# Patient Record
Sex: Female | Born: 1985 | Hispanic: No | Marital: Married | Smoking: Never smoker
Health system: Southern US, Community
[De-identification: ages and names within clinical notes are randomized; demographics above are authoritative.]

## PROBLEM LIST (undated history)

## (undated) DIAGNOSIS — E0590 Thyrotoxicosis, unspecified without thyrotoxic crisis or storm: Secondary | ICD-10-CM

## (undated) DIAGNOSIS — E039 Hypothyroidism, unspecified: Secondary | ICD-10-CM

## (undated) HISTORY — PX: EYE SURGERY: SHX253

---

## 2014-09-10 LAB — OB RESULTS CONSOLE GC/CHLAMYDIA
Chlamydia: NEGATIVE
Gonorrhea: NEGATIVE

## 2014-10-08 LAB — OB RESULTS CONSOLE ABO/RH: RH TYPE: POSITIVE

## 2014-10-08 LAB — OB RESULTS CONSOLE HEPATITIS B SURFACE ANTIGEN: HEP B S AG: NEGATIVE

## 2014-10-08 LAB — OB RESULTS CONSOLE HIV ANTIBODY (ROUTINE TESTING): HIV: NONREACTIVE

## 2014-10-08 LAB — OB RESULTS CONSOLE ANTIBODY SCREEN: ANTIBODY SCREEN: NEGATIVE

## 2014-10-08 LAB — OB RESULTS CONSOLE RUBELLA ANTIBODY, IGM: Rubella: UNDETERMINED

## 2014-10-08 LAB — OB RESULTS CONSOLE RPR: RPR: NONREACTIVE

## 2014-12-18 ENCOUNTER — Inpatient Hospital Stay (HOSPITAL_COMMUNITY): Admission: AD | Admit: 2014-12-18 | Payer: Self-pay | Source: Ambulatory Visit | Admitting: Obstetrics

## 2015-04-28 ENCOUNTER — Encounter (HOSPITAL_COMMUNITY): Payer: Self-pay | Admitting: *Deleted

## 2015-04-28 ENCOUNTER — Inpatient Hospital Stay (HOSPITAL_COMMUNITY)
Admission: EM | Admit: 2015-04-28 | Discharge: 2015-05-03 | DRG: 765 | Disposition: A | Discharge status: Home / Self Care | Payer: PPO | Source: Ambulatory Visit | Attending: Obstetrics | Admitting: Obstetrics

## 2015-04-28 ENCOUNTER — Inpatient Hospital Stay (HOSPITAL_COMMUNITY): Payer: PPO | Admitting: Anesthesiology

## 2015-04-28 DIAGNOSIS — Z79899 Other long term (current) drug therapy: Secondary | ICD-10-CM | POA: Diagnosis not present

## 2015-04-28 DIAGNOSIS — O139 Gestational [pregnancy-induced] hypertension without significant proteinuria, unspecified trimester: Secondary | ICD-10-CM | POA: Diagnosis present

## 2015-04-28 DIAGNOSIS — Z3A38 38 weeks gestation of pregnancy: Secondary | ICD-10-CM | POA: Diagnosis present

## 2015-04-28 DIAGNOSIS — E039 Hypothyroidism, unspecified: Secondary | ICD-10-CM | POA: Diagnosis present

## 2015-04-28 DIAGNOSIS — O9962 Diseases of the digestive system complicating childbirth: Secondary | ICD-10-CM | POA: Diagnosis present

## 2015-04-28 DIAGNOSIS — Z3490 Encounter for supervision of normal pregnancy, unspecified, unspecified trimester: Secondary | ICD-10-CM

## 2015-04-28 DIAGNOSIS — D62 Acute posthemorrhagic anemia: Secondary | ICD-10-CM | POA: Diagnosis not present

## 2015-04-28 DIAGNOSIS — Z2839 Other underimmunization status: Secondary | ICD-10-CM

## 2015-04-28 DIAGNOSIS — O9989 Other specified diseases and conditions complicating pregnancy, childbirth and the puerperium: Secondary | ICD-10-CM

## 2015-04-28 DIAGNOSIS — O99284 Endocrine, nutritional and metabolic diseases complicating childbirth: Secondary | ICD-10-CM | POA: Diagnosis present

## 2015-04-28 DIAGNOSIS — Z283 Underimmunization status: Secondary | ICD-10-CM

## 2015-04-28 DIAGNOSIS — O9902 Anemia complicating childbirth: Secondary | ICD-10-CM | POA: Diagnosis not present

## 2015-04-28 DIAGNOSIS — O3421 Maternal care for scar from previous cesarean delivery: Secondary | ICD-10-CM | POA: Diagnosis present

## 2015-04-28 DIAGNOSIS — K219 Gastro-esophageal reflux disease without esophagitis: Secondary | ICD-10-CM | POA: Diagnosis present

## 2015-04-28 DIAGNOSIS — Z98891 History of uterine scar from previous surgery: Secondary | ICD-10-CM

## 2015-04-28 DIAGNOSIS — O133 Gestational [pregnancy-induced] hypertension without significant proteinuria, third trimester: Principal | ICD-10-CM | POA: Diagnosis present

## 2015-04-28 HISTORY — DX: Thyrotoxicosis, unspecified without thyrotoxic crisis or storm: E05.90

## 2015-04-28 LAB — CBC
HCT: 36 % (ref 36.0–46.0)
Hemoglobin: 12.2 g/dL (ref 12.0–15.0)
MCH: 28 pg (ref 26.0–34.0)
MCHC: 33.9 g/dL (ref 30.0–36.0)
MCV: 82.6 fL (ref 78.0–100.0)
Platelets: 185 10*3/uL (ref 150–400)
RBC: 4.36 MIL/uL (ref 3.87–5.11)
RDW: 14.9 % (ref 11.5–15.5)
WBC: 8.1 10*3/uL (ref 4.0–10.5)

## 2015-04-28 LAB — COMPREHENSIVE METABOLIC PANEL
ALT: 9 U/L — ABNORMAL LOW (ref 14–54)
AST: 19 U/L (ref 15–41)
Albumin: 2.8 g/dL — ABNORMAL LOW (ref 3.5–5.0)
Alkaline Phosphatase: 121 U/L (ref 38–126)
Anion gap: 9 (ref 5–15)
BILIRUBIN TOTAL: 0.2 mg/dL — AB (ref 0.3–1.2)
BUN: 14 mg/dL (ref 6–20)
CO2: 21 mmol/L — ABNORMAL LOW (ref 22–32)
CREATININE: 0.61 mg/dL (ref 0.44–1.00)
Calcium: 9.1 mg/dL (ref 8.9–10.3)
Chloride: 105 mmol/L (ref 101–111)
GFR calc Af Amer: >60 mL/min (ref >60)
GLUCOSE: 96 mg/dL (ref 65–99)
POTASSIUM: 3.9 mmol/L (ref 3.5–5.1)
Sodium: 135 mmol/L (ref 135–145)
TOTAL PROTEIN: 6.1 g/dL — AB (ref 6.5–8.1)

## 2015-04-28 LAB — URINE MICROSCOPIC-ADD ON

## 2015-04-28 LAB — PROTEIN / CREATININE RATIO, URINE
CREATININE, URINE: 113 mg/dL
PROTEIN CREATININE RATIO: 0.12 mg/mg{creat} (ref 0.00–0.15)
Total Protein, Urine: 14 mg/dL

## 2015-04-28 LAB — LACTATE DEHYDROGENASE: LDH: 120 U/L (ref 98–192)

## 2015-04-28 LAB — TYPE AND SCREEN
ABO/RH(D): O POS
ANTIBODY SCREEN: NEGATIVE

## 2015-04-28 LAB — URINALYSIS, ROUTINE W REFLEX MICROSCOPIC
BILIRUBIN URINE: NEGATIVE
Glucose, UA: NEGATIVE mg/dL
Ketones, ur: NEGATIVE mg/dL
Nitrite: NEGATIVE
Protein, ur: NEGATIVE mg/dL
Specific Gravity, Urine: 1.02 (ref 1.005–1.030)
UROBILINOGEN UA: 0.2 mg/dL (ref 0.0–1.0)
pH: 6 (ref 5.0–8.0)

## 2015-04-28 LAB — URIC ACID: Uric Acid, Serum: 5.5 mg/dL (ref 2.3–6.6)

## 2015-04-28 LAB — OB RESULTS CONSOLE GBS: GBS: NEGATIVE

## 2015-04-28 LAB — ABO/RH: ABO/RH(D): O POS

## 2015-04-28 MED ORDER — ONDANSETRON HCL 4 MG/2ML IJ SOLN: 4.0000 mg | Freq: Four times a day (QID) | INTRAMUSCULAR | Status: DC | PRN

## 2015-04-28 MED ORDER — OXYTOCIN BOLUS FROM INFUSION: 500.0000 mL | INTRAVENOUS | Status: DC

## 2015-04-28 MED ORDER — CITRIC ACID-SODIUM CITRATE 334-500 MG/5ML PO SOLN: 30.0000 mL | ORAL | Status: DC | PRN

## 2015-04-28 MED ORDER — TERBUTALINE SULFATE 1 MG/ML IJ SOLN: 0.2500 mg | Freq: Once | INTRAMUSCULAR | Status: AC | PRN

## 2015-04-28 MED ORDER — ACETAMINOPHEN 325 MG PO TABS: 650.0000 mg | ORAL_TABLET | ORAL | Status: DC | PRN

## 2015-04-28 MED ORDER — EPHEDRINE 5 MG/ML INJ: 10.0000 mg | INTRAVENOUS | Status: DC | PRN

## 2015-04-28 MED ORDER — LACTATED RINGERS IV SOLN
INTRAVENOUS | Status: DC
  Administered 2015-04-28 – 2015-04-29 (×4): via INTRAVENOUS

## 2015-04-28 MED ORDER — OXYTOCIN 40 UNITS IN LACTATED RINGERS INFUSION - SIMPLE MED: 62.5000 mL/h | INTRAVENOUS | Status: DC

## 2015-04-28 MED ORDER — FLEET ENEMA 7-19 GM/118ML RE ENEM: 1.0000 | ENEMA | RECTAL | Status: DC | PRN

## 2015-04-28 MED ORDER — OXYCODONE-ACETAMINOPHEN 5-325 MG PO TABS: 2.0000 | ORAL_TABLET | ORAL | Status: DC | PRN

## 2015-04-28 MED ORDER — LACTATED RINGERS IV SOLN
500.0000 mL | INTRAVENOUS | Status: DC | PRN
  Administered 2015-04-28: 500 mL via INTRAVENOUS

## 2015-04-28 MED ORDER — DIPHENHYDRAMINE HCL 50 MG/ML IJ SOLN: 12.5000 mg | INTRAMUSCULAR | Status: DC | PRN

## 2015-04-28 MED ORDER — BUTORPHANOL TARTRATE 1 MG/ML IJ SOLN
1.0000 mg | INTRAMUSCULAR | Status: DC | PRN
  Administered 2015-04-28: 1 mg via INTRAVENOUS
  Filled 2015-04-28: qty 1

## 2015-04-28 MED ORDER — FENTANYL 2.5 MCG/ML BUPIVACAINE 1/10 % EPIDURAL INFUSION (WH - ANES)
14.0000 mL/h | INTRAMUSCULAR | Status: DC | PRN
  Administered 2015-04-28: 12 mL/h via EPIDURAL
  Administered 2015-04-28: 14 mL/h via EPIDURAL
  Filled 2015-04-28: qty 125

## 2015-04-28 MED ORDER — OXYCODONE-ACETAMINOPHEN 5-325 MG PO TABS: 1.0000 | ORAL_TABLET | ORAL | Status: DC | PRN

## 2015-04-28 MED ORDER — OXYTOCIN 40 UNITS IN LACTATED RINGERS INFUSION - SIMPLE MED
1.0000 m[IU]/min | INTRAVENOUS | Status: DC
  Administered 2015-04-28: 2 m[IU]/min via INTRAVENOUS
  Filled 2015-04-28: qty 1000

## 2015-04-28 MED ORDER — LIDOCAINE HCL (PF) 1 % IJ SOLN: 30.0000 mL | INTRAMUSCULAR | Status: DC | PRN

## 2015-04-28 MED ORDER — PHENYLEPHRINE 40 MCG/ML (10ML) SYRINGE FOR IV PUSH (FOR BLOOD PRESSURE SUPPORT)
80.0000 ug | PREFILLED_SYRINGE | INTRAVENOUS | Status: DC | PRN
  Filled 2015-04-28: qty 20

## 2015-04-28 NOTE — Anesthesia Preprocedure Evaluation (Signed)
Anesthesia Evaluation  Patient identified by MRN, date of birth, ID band  Reviewed: Allergy & Precautions, NPO status , Patient's Chart, lab work & pertinent test results  History of Anesthesia Complications Negative for: history of anesthetic complications  Airway Mallampati: III  TM Distance: >3 FB Neck ROM: Full    Dental  (+) Teeth Intact   Pulmonary neg pulmonary ROS,  breath sounds clear to auscultation        Cardiovascular negative cardio ROS  Rhythm:Regular     Neuro/Psych negative neurological ROS  negative psych ROS   GI/Hepatic negative GI ROS, Neg liver ROS,   Endo/Other  Hypothyroidism   Renal/GU negative Renal ROS     Musculoskeletal   Abdominal   Peds  Hematology negative hematology ROS (+)   Anesthesia Other Findings   Reproductive/Obstetrics (+) Pregnancy                             Anesthesia Physical Anesthesia Plan  ASA: II  Anesthesia Plan: Epidural   Post-op Pain Management:    Induction:   Airway Management Planned:   Additional Equipment:   Intra-op Plan:   Post-operative Plan:   Informed Consent: I have reviewed the patients History and Physical, chart, labs and discussed the procedure including the risks, benefits and alternatives for the proposed anesthesia with the patient or authorized representative who has indicated his/her understanding and acceptance.     Plan Discussed with: Anesthesiologist  Anesthesia Plan Comments:         Anesthesia Quick Evaluation

## 2015-04-28 NOTE — Progress Notes (Signed)
S: Doing well, no complaints, pain well controlled without meds, planning epidural when contractions stronger. No HA, no vision change  O: BP 125/67 mmHg  Pulse 62  Temp(Src) 98 F (36.7 C) (Oral)  Resp 20  Ht 5\' 4"  (1.626 m)  Wt 102.059 kg (225 lb)  BMI 38.60 kg/m2   FHT:  FHR: 120s bpm, variability: moderate,  accelerations:  Present,  decelerations:  Absent UC:   Not tracing well, ? q 4 min, pit at 10munits- increased to 12 munits/ min SVE:   Dilation: 3 Effacement (%): Thick Station: -3 Exam by:: Dr. Ernestina PennaFogleman  vtx   A / P:  29 y.o.  Obstetric History   G2   P1   T1   P0   A0   TAB0   SAB0   E0   M0   L1    at 3729w3d IOL, gest htn, h/o such, bps 140/90 x 2 in office. Nl labs, r/o PEC today but given >37 wks move to IOL, despite plans for VBAC/ Pt aware risks  Fetal Wellbeing:  Category I Pain Control:  Labor support without medications  Anticipated MOD:  NSVD  Melissa [name] A. 04/28/2015, 9:16 PM

## 2015-04-28 NOTE — Progress Notes (Signed)
[place] interpreter called for admission. [place] [phone]

## 2015-04-28 NOTE — Anesthesia Preprocedure Evaluation (Addendum)
Anesthesia Evaluation  Patient identified by MRN, date of birth, ID band Patient awake    Reviewed: Allergy & Precautions, NPO status , Patient's Chart, lab work & pertinent test results  History of Anesthesia Complications Negative for: history of anesthetic complications  Airway Mallampati: III  TM Distance: >3 FB Neck ROM: Full    Dental  (+) Teeth Intact   Pulmonary neg pulmonary ROS,  breath sounds clear to auscultation        Cardiovascular negative cardio ROS  Rhythm:Regular     Neuro/Psych negative neurological ROS  negative psych ROS   GI/Hepatic Neg liver ROS, GERD-  ,  Endo/Other  Hypothyroidism Morbid obesity  Renal/GU negative Renal ROS  negative genitourinary   Musculoskeletal   Abdominal (+) + obese,   Peds  Hematology   Anesthesia Other Findings   Reproductive/Obstetrics (+) Pregnancy [place]                            Anesthesia Physical Anesthesia Plan  ASA: II and emergent  Anesthesia Plan: Epidural and General   Post-op Pain Management:    Induction:   Airway Management Planned: Natural Airway  Additional Equipment:   Intra-op Plan:   Post-operative Plan: Extubation in OR  Informed Consent: I have reviewed the patients History and Physical, chart, labs and discussed the procedure including the risks, benefits and alternatives for the proposed anesthesia with the patient or authorized representative who has indicated his/her understanding and acceptance.   Dental advisory given  Plan Discussed with: CRNA and Surgeon  Anesthesia Plan Comments: (I had a long discussion with Naviah using the Arabic interpreter via the phone. She is concerned about Epidural placement and possible side effects and complications. I had a long discussion about the risks, benefits and alternatives of regional anesthesia in pregnancy including failed  block, back pain, hematoma, infection, allergic and toxic reactions to local anesthetics, post dural puncture headache and nerve damage or paralysis. Questions were answered. Her husband who speaks English was present in the room. His questions were answered. She states she will probably want an epidural later in her labor. She understands that in the event of need for repeat C/Section an epidural catheter can also be used for anesthesia for a C/Section. )       Anesthesia Quick Evaluation

## 2015-04-28 NOTE — H&P (Signed)
Marilin Kofman is a 29 y.o. G2P1001 at 61w3dpresenting for IOL due to gest htn. Pt notes rare contractions. Good fetal movement, No vaginal bleeding, not leaking fluid. No HA, no vision change, no RUQ pain. Pt presented to office for routine OB visit. bp 140/90 x 2 in resting state.   [place] - Dated by 9 wk u/s - planned preg after IUD removed - hypothyroidism, several adjustments made throughout preg, labs q 6 wks - prior PCS for breech, plannVBAC, R/B d/w pt throughout preg. - obesity, DS x 2. DS 136 at 28 wks, nl 3 hr GTT - h/o gest htn in prior preg. - GERD - initial placenta previa to low lying to post placenta - Rubella Indeterminate - nl Informaseq, XY - u/s today: 6'13, 42%, AFI 13, post placenta, vtx, BPP 8/8    Prenatal Transfer Tool  Maternal Diabetes: No Genetic Screening: Normal Maternal Ultrasounds/Referrals: Normal Fetal Ultrasounds or other Referrals:  None Maternal Substance Abuse:  No Significant Maternal Medications:  None Significant Maternal Lab Results: None     OB History    Gravida Para Term Preterm AB TAB SAB Ectopic Multiple Living   _0 0 0 0 0 0 0 1     No past medical history on file. No past surgical history on file. Family History: family history is not on file. Social History:  has no tobacco, alcohol, and drug history on file.  Review of Systems - Negative except nervous about IOL     Blood pressure 124/67, pulse 82, temperature 98.3 F (36.8 C), temperature source Oral, resp. rate 20.  Physical Exam:  Gen: well appearing, no distress CV: RRR Pulm: CTAB Back: no CVAT Abd: gravid, NT, no RUQ pain LE: trace edema, equal bilaterally, non-tender, DTR 2+, no clonus Cvx: per nursing notes   Prenatal labs: ABO, Rh: O/Positive/-- (11/06 0000) Antibody: Negative (11/06 0000) Rubella:   indeterminate RPR: Nonreactive (11/06 0000)  HBsAg: Negative (11/06 0000)  HIV: Non-reactive (11/06 0000)  GBS:  Negative (05/26 0000)  1 hr Glucola 136, nl 3hr GTT  Genetic screening nl Informaseq, nl AFP Anatomy [place]ormal   Assessment/Plan: 29y.o. G2P1001 at 363w3d gest htn. Check PIH labs. Watch bp closely. Given >37 wks, recc move to delivery. Pt aware increases risk of failed VBAC - VBAC. Pt and husband understand R/B - GBS neg - Nl fetal growth - MMR PP   Dyshaun Bonzo A. 04/28/2015, 4:06 PM

## 2015-04-29 ENCOUNTER — Inpatient Hospital Stay (HOSPITAL_COMMUNITY): Payer: PPO | Admitting: Anesthesiology

## 2015-04-29 ENCOUNTER — Encounter (HOSPITAL_COMMUNITY): Payer: Self-pay | Admitting: *Deleted

## 2015-04-29 ENCOUNTER — Encounter (HOSPITAL_COMMUNITY)
Admission: EM | Disposition: A | Discharge status: Home / Self Care | Payer: Self-pay | Source: Ambulatory Visit | Attending: Obstetrics

## 2015-04-29 ENCOUNTER — Inpatient Hospital Stay (HOSPITAL_COMMUNITY): Payer: PPO

## 2015-04-29 LAB — CBC
HEMATOCRIT: 31.3 % — AB (ref 36.0–46.0)
HEMOGLOBIN: 10.7 g/dL — AB (ref 12.0–15.0)
MCH: 28.2 pg (ref 26.0–34.0)
MCHC: 34.2 g/dL (ref 30.0–36.0)
MCV: 82.6 fL (ref 78.0–100.0)
PLATELETS: 197 10*3/uL (ref 150–400)
RBC: 3.79 MIL/uL — ABNORMAL LOW (ref 3.87–5.11)
RDW: 14.9 % (ref 11.5–15.5)
WBC: 12.1 10*3/uL — AB (ref 4.0–10.5)

## 2015-04-29 LAB — RPR: RPR Ser Ql: NONREACTIVE

## 2015-04-29 SURGERY — Surgical Case
Anesthesia: Epidural

## 2015-04-29 MED ORDER — SODIUM CHLORIDE 0.9 % IJ SOLN: 3.0000 mL | INTRAMUSCULAR | Status: DC | PRN

## 2015-04-29 MED ORDER — SIMETHICONE 80 MG PO CHEW
80.0000 mg | CHEWABLE_TABLET | ORAL | Status: DC
  Administered 2015-04-29 – 2015-05-02 (×4): 80 mg via ORAL
  Filled 2015-04-29 (×4): qty 1

## 2015-04-29 MED ORDER — DIPHENHYDRAMINE HCL 25 MG PO CAPS: 25.0000 mg | ORAL_CAPSULE | Freq: Four times a day (QID) | ORAL | Status: DC | PRN

## 2015-04-29 MED ORDER — MIDAZOLAM HCL 2 MG/2ML IJ SOLN
INTRAMUSCULAR | Status: AC
  Filled 2015-04-29: qty 2

## 2015-04-29 MED ORDER — SIMETHICONE 80 MG PO CHEW
80.0000 mg | CHEWABLE_TABLET | Freq: Three times a day (TID) | ORAL | Status: DC
  Administered 2015-04-29 – 2015-05-03 (×13): 80 mg via ORAL
  Filled 2015-04-29 (×13): qty 1

## 2015-04-29 MED ORDER — FENTANYL CITRATE (PF) 250 MCG/5ML IJ SOLN
INTRAMUSCULAR | Status: AC
  Filled 2015-04-29: qty 5

## 2015-04-29 MED ORDER — SODIUM BICARBONATE 8.4 % IV SOLN
INTRAVENOUS | Status: AC
  Filled 2015-04-29: qty 50

## 2015-04-29 MED ORDER — SIMETHICONE 80 MG PO CHEW: 80.0000 mg | CHEWABLE_TABLET | ORAL | Status: DC | PRN

## 2015-04-29 MED ORDER — MIDAZOLAM HCL 5 MG/5ML IJ SOLN
INTRAMUSCULAR | Status: DC | PRN
  Administered 2015-04-29: 2 mg via INTRAVENOUS

## 2015-04-29 MED ORDER — LIDOCAINE-EPINEPHRINE (PF) 2 %-1:200000 IJ SOLN
INTRAMUSCULAR | Status: DC | PRN
  Administered 2015-04-29: 10 mL via EPIDURAL

## 2015-04-29 MED ORDER — OXYTOCIN 40 UNITS IN LACTATED RINGERS INFUSION - SIMPLE MED: 62.5000 mL/h | INTRAVENOUS | Status: AC

## 2015-04-29 MED ORDER — MEPERIDINE HCL 25 MG/ML IJ SOLN
INTRAMUSCULAR | Status: DC | PRN
  Administered 2015-04-29 (×2): 12.5 mg via INTRAVENOUS

## 2015-04-29 MED ORDER — FENTANYL CITRATE (PF) 100 MCG/2ML IJ SOLN: 25.0000 ug | INTRAMUSCULAR | Status: DC | PRN

## 2015-04-29 MED ORDER — LACTATED RINGERS IV SOLN
INTRAVENOUS | Status: DC | PRN
  Administered 2015-04-29: 04:00:00 via INTRAVENOUS

## 2015-04-29 MED ORDER — TETANUS-DIPHTH-ACELL PERTUSSIS 5-2.5-18.5 LF-MCG/0.5 IM SUSP: 0.5000 mL | Freq: Once | INTRAMUSCULAR | Status: DC

## 2015-04-29 MED ORDER — CEFAZOLIN SODIUM-DEXTROSE 2-3 GM-% IV SOLR
INTRAVENOUS | Status: AC
Start: 2015-04-29 — End: 2015-04-29
  Filled 2015-04-29: qty 50

## 2015-04-29 MED ORDER — SENNOSIDES-DOCUSATE SODIUM 8.6-50 MG PO TABS
2.0000 | ORAL_TABLET | ORAL | Status: DC
  Administered 2015-04-29 – 2015-05-02 (×3): 2 via ORAL
  Filled 2015-04-29 (×4): qty 2

## 2015-04-29 MED ORDER — NALBUPHINE HCL 10 MG/ML IJ SOLN: 5.0000 mg | Freq: Once | INTRAMUSCULAR | Status: AC | PRN

## 2015-04-29 MED ORDER — MENTHOL 3 MG MT LOZG: 1.0000 | LOZENGE | OROMUCOSAL | Status: DC | PRN

## 2015-04-29 MED ORDER — DIBUCAINE 1 % RE OINT: 1.0000 "application " | TOPICAL_OINTMENT | RECTAL | Status: DC | PRN

## 2015-04-29 MED ORDER — LACTATED RINGERS IV SOLN
40.0000 [IU] | INTRAVENOUS | Status: DC | PRN
  Administered 2015-04-29: 40 [IU] via INTRAVENOUS

## 2015-04-29 MED ORDER — NALBUPHINE HCL 10 MG/ML IJ SOLN: 5.0000 mg | INTRAMUSCULAR | Status: DC | PRN

## 2015-04-29 MED ORDER — SUCCINYLCHOLINE CHLORIDE 20 MG/ML IJ SOLN
INTRAMUSCULAR | Status: DC | PRN
  Administered 2015-04-29: 100 mg via INTRAVENOUS

## 2015-04-29 MED ORDER — ZOLPIDEM TARTRATE 5 MG PO TABS: 5.0000 mg | ORAL_TABLET | Freq: Every evening | ORAL | Status: DC | PRN

## 2015-04-29 MED ORDER — LACTATED RINGERS IV SOLN: INTRAVENOUS | Status: DC

## 2015-04-29 MED ORDER — BUPIVACAINE HCL (PF) 0.25 % IJ SOLN
INTRAMUSCULAR | Status: DC | PRN
  Administered 2015-04-28 (×2): 4 mL

## 2015-04-29 MED ORDER — SCOPOLAMINE 1 MG/3DAYS TD PT72
1.0000 | MEDICATED_PATCH | Freq: Once | TRANSDERMAL | Status: DC
  Filled 2015-04-29: qty 1

## 2015-04-29 MED ORDER — DIPHENHYDRAMINE HCL 25 MG PO CAPS: 25.0000 mg | ORAL_CAPSULE | ORAL | Status: DC | PRN

## 2015-04-29 MED ORDER — DIPHENHYDRAMINE HCL 50 MG/ML IJ SOLN: 12.5000 mg | INTRAMUSCULAR | Status: DC | PRN

## 2015-04-29 MED ORDER — ONDANSETRON HCL 4 MG/2ML IJ SOLN: 4.0000 mg | Freq: Three times a day (TID) | INTRAMUSCULAR | Status: DC | PRN

## 2015-04-29 MED ORDER — PHENYLEPHRINE 40 MCG/ML (10ML) SYRINGE FOR IV PUSH (FOR BLOOD PRESSURE SUPPORT)
PREFILLED_SYRINGE | INTRAVENOUS | Status: AC
  Filled 2015-04-29: qty 10

## 2015-04-29 MED ORDER — LIDOCAINE-EPINEPHRINE 2 %-1:100000 IJ SOLN: INTRAMUSCULAR | Status: DC | PRN

## 2015-04-29 MED ORDER — KETOROLAC TROMETHAMINE 30 MG/ML IJ SOLN
30.0000 mg | Freq: Four times a day (QID) | INTRAMUSCULAR | Status: DC | PRN
  Administered 2015-04-29: 30 mg via INTRAVENOUS
  Filled 2015-04-29: qty 1

## 2015-04-29 MED ORDER — LEVOTHYROXINE SODIUM 88 MCG PO TABS
88.0000 ug | ORAL_TABLET | Freq: Every day | ORAL | Status: DC
  Administered 2015-04-29 – 2015-05-03 (×5): 88 ug via ORAL
  Filled 2015-04-29 (×6): qty 1

## 2015-04-29 MED ORDER — LANOLIN HYDROUS EX OINT: 1.0000 "application " | TOPICAL_OINTMENT | CUTANEOUS | Status: DC | PRN

## 2015-04-29 MED ORDER — MEPERIDINE HCL 25 MG/ML IJ SOLN
6.2500 mg | INTRAMUSCULAR | Status: DC | PRN
Start: 2015-04-29 — End: 2015-04-29

## 2015-04-29 MED ORDER — MORPHINE SULFATE 0.5 MG/ML IJ SOLN
INTRAMUSCULAR | Status: AC
  Filled 2015-04-29: qty 10

## 2015-04-29 MED ORDER — LIDOCAINE-EPINEPHRINE (PF) 2 %-1:200000 IJ SOLN
INTRAMUSCULAR | Status: AC
  Filled 2015-04-29: qty 20

## 2015-04-29 MED ORDER — MORPHINE SULFATE (PF) 0.5 MG/ML IJ SOLN
INTRAMUSCULAR | Status: DC | PRN
  Administered 2015-04-29: 4 mg via EPIDURAL

## 2015-04-29 MED ORDER — ACETAMINOPHEN 325 MG PO TABS: 650.0000 mg | ORAL_TABLET | ORAL | Status: DC | PRN

## 2015-04-29 MED ORDER — LACTATED RINGERS IV BOLUS (SEPSIS): 500.0000 mL | Freq: Once | INTRAVENOUS | Status: DC

## 2015-04-29 MED ORDER — ONDANSETRON HCL 4 MG/2ML IJ SOLN
INTRAMUSCULAR | Status: AC
  Filled 2015-04-29: qty 2

## 2015-04-29 MED ORDER — OXYCODONE-ACETAMINOPHEN 5-325 MG PO TABS
2.0000 | ORAL_TABLET | ORAL | Status: DC | PRN
  Administered 2015-04-30 – 2015-05-03 (×3): 2 via ORAL
  Filled 2015-04-29 (×4): qty 2

## 2015-04-29 MED ORDER — NALOXONE HCL 1 MG/ML IJ SOLN
1.0000 ug/kg/h | INTRAVENOUS | Status: DC | PRN
  Filled 2015-04-29: qty 2

## 2015-04-29 MED ORDER — TERBUTALINE SULFATE 1 MG/ML IJ SOLN
INTRAMUSCULAR | Status: AC
  Filled 2015-04-29: qty 1

## 2015-04-29 MED ORDER — ONDANSETRON HCL 4 MG/2ML IJ SOLN
INTRAMUSCULAR | Status: DC | PRN
  Administered 2015-04-29: 4 mg via INTRAVENOUS

## 2015-04-29 MED ORDER — PRENATAL MULTIVITAMIN CH
1.0000 | ORAL_TABLET | Freq: Every day | ORAL | Status: DC
  Administered 2015-04-29 – 2015-05-03 (×5): 1 via ORAL
  Filled 2015-04-29 (×5): qty 1

## 2015-04-29 MED ORDER — SODIUM CHLORIDE 0.9 % IR SOLN
Status: DC | PRN
  Administered 2015-04-29: 1000 mL

## 2015-04-29 MED ORDER — OXYCODONE-ACETAMINOPHEN 5-325 MG PO TABS
1.0000 | ORAL_TABLET | ORAL | Status: DC | PRN
  Administered 2015-04-30 – 2015-05-03 (×6): 1 via ORAL
  Filled 2015-04-29 (×5): qty 1

## 2015-04-29 MED ORDER — WITCH HAZEL-GLYCERIN EX PADS: 1.0000 "application " | MEDICATED_PAD | CUTANEOUS | Status: DC | PRN

## 2015-04-29 MED ORDER — PHENYLEPHRINE HCL 10 MG/ML IJ SOLN
INTRAMUSCULAR | Status: DC | PRN
  Administered 2015-04-29: 80 ug via INTRAVENOUS

## 2015-04-29 MED ORDER — LIDOCAINE-EPINEPHRINE (PF) 2 %-1:200000 IJ SOLN
INTRAMUSCULAR | Status: DC | PRN
  Administered 2015-04-28: 5 mL

## 2015-04-29 MED ORDER — PROPOFOL 10 MG/ML IV BOLUS
INTRAVENOUS | Status: AC
Start: 2015-04-29 — End: 2015-04-29
  Filled 2015-04-29: qty 40

## 2015-04-29 MED ORDER — KETOROLAC TROMETHAMINE 30 MG/ML IJ SOLN: 30.0000 mg | Freq: Four times a day (QID) | INTRAMUSCULAR | Status: DC | PRN

## 2015-04-29 MED ORDER — NALOXONE HCL 0.4 MG/ML IJ SOLN: 0.4000 mg | INTRAMUSCULAR | Status: DC | PRN

## 2015-04-29 MED ORDER — MEPERIDINE HCL 25 MG/ML IJ SOLN
INTRAMUSCULAR | Status: AC
  Filled 2015-04-29: qty 1

## 2015-04-29 MED ORDER — PROPOFOL 10 MG/ML IV BOLUS
INTRAVENOUS | Status: DC | PRN
  Administered 2015-04-29: 180 mg via INTRAVENOUS

## 2015-04-29 MED ORDER — OXYTOCIN 10 UNIT/ML IJ SOLN
INTRAMUSCULAR | Status: AC
  Filled 2015-04-29: qty 4

## 2015-04-29 MED ORDER — IBUPROFEN 600 MG PO TABS
600.0000 mg | ORAL_TABLET | Freq: Four times a day (QID) | ORAL | Status: DC
  Administered 2015-04-29 – 2015-04-30 (×3): 600 mg via ORAL
  Filled 2015-04-29 (×3): qty 1

## 2015-04-29 SURGICAL SUPPLY — 36 items
CLAMP CORD UMBIL (MISCELLANEOUS) ×3 IMPLANT
CLOSURE WOUND 1/2 X4 (GAUZE/BANDAGES/DRESSINGS)
CLOTH BEACON ORANGE TIMEOUT ST (SAFETY) ×3 IMPLANT
CONTAINER PREFILL 10% NBF 15ML (MISCELLANEOUS) IMPLANT
DRAPE SHEET LG 3/4 BI-LAMINATE (DRAPES) ×3 IMPLANT
DRSG OPSITE POSTOP 4X10 (GAUZE/BANDAGES/DRESSINGS) ×3 IMPLANT
DURAPREP 26ML APPLICATOR (WOUND CARE) ×3 IMPLANT
ELECT REM PT RETURN 9FT ADLT (ELECTROSURGICAL) ×3
ELECTRODE REM PT RTRN 9FT ADLT (ELECTROSURGICAL) ×1 IMPLANT
EXTRACTOR VACUUM M CUP 4 TUBE (SUCTIONS) IMPLANT
EXTRACTOR VACUUM M CUP 4' TUBE (SUCTIONS)
GLOVE BIO SURGEON STRL SZ 6.5 (GLOVE) ×2 IMPLANT
GLOVE BIO SURGEONS STRL SZ 6.5 (GLOVE) ×1
GLOVE BIOGEL PI IND STRL 7.0 (GLOVE) ×1 IMPLANT
GLOVE BIOGEL PI INDICATOR 7.0 (GLOVE) ×2
GOWN STRL REUS W/TWL LRG LVL3 (GOWN DISPOSABLE) ×9 IMPLANT
KIT ABG SYR 3ML LUER SLIP (SYRINGE) ×3 IMPLANT
NEEDLE HYPO 22GX1.5 SAFETY (NEEDLE) IMPLANT
NEEDLE HYPO 25X5/8 SAFETYGLIDE (NEEDLE) ×3 IMPLANT
NS IRRIG 1000ML POUR BTL (IV SOLUTION) ×3 IMPLANT
PACK C SECTION WH (CUSTOM PROCEDURE TRAY) ×3 IMPLANT
PAD OB MATERNITY 4.3X12.25 (PERSONAL CARE ITEMS) ×3 IMPLANT
SPONGE SURGIFOAM ABS GEL 12-7 (HEMOSTASIS) ×3 IMPLANT
STAPLER VISISTAT 35W (STAPLE) ×3 IMPLANT
STRIP CLOSURE SKIN 1/2X4 (GAUZE/BANDAGES/DRESSINGS) IMPLANT
SUT MON AB 4-0 PS1 27 (SUTURE) ×3 IMPLANT
SUT PLAIN 0 NONE (SUTURE) IMPLANT
SUT PLAIN 2 0 XLH (SUTURE) IMPLANT
SUT VIC AB 0 CT1 36 (SUTURE) ×6 IMPLANT
SUT VIC AB 0 CTX 36 (SUTURE) ×4
SUT VIC AB 0 CTX36XBRD ANBCTRL (SUTURE) ×2 IMPLANT
SUT VIC AB 2-0 CT1 27 (SUTURE) ×2
SUT VIC AB 2-0 CT1 TAPERPNT 27 (SUTURE) ×1 IMPLANT
SYR CONTROL 10ML LL (SYRINGE) ×3 IMPLANT
TOWEL OR 17X24 6PK STRL BLUE (TOWEL DISPOSABLE) ×3 IMPLANT
TRAY FOLEY CATH SILVER 14FR (SET/KITS/TRAYS/PACK) IMPLANT

## 2015-04-29 NOTE — Anesthesia Procedure Notes (Signed)
Epidural Patient location during procedure: OB  Staffing Anesthesiologist: Deanda Ruddell, CHRIS Performed by: anesthesiologist   Preanesthetic Checklist Completed: patient identified, surgical consent, pre-op evaluation, timeout performed, IV checked, risks and benefits discussed and monitors and equipment checked  Epidural Patient position: sitting Prep: site prepped and draped and DuraPrep Patient monitoring: heart rate, cardiac monitor, continuous pulse ox and blood pressure Approach: midline Location: L3-L4 Injection technique: LOR saline  Needle:  Needle type: Tuohy  Needle gauge: 17 G Needle length: 9 cm Needle insertion depth: 7 cm Catheter type: closed end flexible Catheter size: 19 Gauge Catheter at skin depth: 13 cm Test dose: negative and 2% lidocaine with Epi 1:200 K  Assessment Events: blood not aspirated, injection not painful, no injection resistance, negative IV test and no paresthesia  Additional Notes H+P and labs checked, risks and benefits discussed with the patient, consent obtained, procedure tolerated well and without complications.  Reason for block:procedure for pain   

## 2015-04-29 NOTE — Brief Op Note (Signed)
04/28/2015 - 04/29/2015  4:35 AM  PATIENT:  Melissa [name]  29 y.o. female  PRE-OPERATIVE DIAGNOSIS:  Cord Prolapse, fetal distress, gestational hypertension  POST-OPERATIVE DIAGNOSIS:  Fetal distress, umbilical cord prolapse, gestational hypertension  PROCEDURE:  Procedure(s): CESAREAN SECTION (N/A), urgent repeat cesarean section, 2 layer closure  SURGEON:  Surgeon(s) and Role:    * Noland FordyceKelly Bessy Reaney, MD - Primary  PHYSICIAN ASSISTANT:   ASSISTANTS: none   ANESTHESIA:   epidural and general  EBL:    per anesthesia  BLOOD ADMINISTERED:none  DRAINS: Urinary Catheter (Foley)   LOCAL MEDICATIONS USED:  NONE  SPECIMEN:  Source of Specimen:  Umbilical cord blood  DISPOSITION OF SPECIMEN:  Laboratory, placenta to labor and delivery  COUNTS:  NO X-ray pending  TOURNIQUET:  * No tourniquets in log *  DICTATION: .Dragon Dictation  PLAN OF CARE: Admit to inpatient   PATIENT DISPOSITION:  PACU - hemodynamically stable.   Delay start of Pharmacological VTE agent (>24hrs) due to surgical blood loss or risk of bleeding: yes

## 2015-04-29 NOTE — Progress Notes (Signed)
Patient has had low BP readings for orthostatic reading  x2 over last 3 hours.  88-95/ 45-60's See VS flow sheet. PO fluid intake adequate, urine output good, abdominal dressing dry, minimal vaginal bleeding.. Lightheaded when standing at bedside and unable to walk to bathroom. Rober MinionMelanie Bhambri,CNM, notified of above.  Orders received to give IV fluid bolus of 500ml LR and recheck vs in 2 hrs. Will monitor closely. Report given to Mickey Farberenea Melton, RN , taking care of pt for next 12 hrs

## 2015-04-29 NOTE — Op Note (Signed)
04/28/2015 - 04/29/2015  4:35 AM  PATIENT:  Melissa [name]  29 y.o. female  PRE-OPERATIVE DIAGNOSIS:  Cord Prolapse, fetal distress, gestational hypertension  POST-OPERATIVE DIAGNOSIS:  Fetal distress, umbilical cord prolapse, gestational hypertension  PROCEDURE:  Procedure(s): CESAREAN SECTION (N/A), urgent repeat cesarean section, 2 layer closure  SURGEON:  Surgeon(s) and Role:    * Noland FordyceKelly Troyce Gieske, MD - Primary  PHYSICIAN ASSISTANT:   ASSISTANTS: none   ANESTHESIA:   epidural and general  EBL:    per anesthesia  BLOOD ADMINISTERED:none  DRAINS: Urinary Catheter (Foley)   LOCAL MEDICATIONS USED:  NONE  SPECIMEN:  Source of Specimen:  Umbilical cord blood  DISPOSITION OF SPECIMEN:  Laboratory, placenta to labor and delivery  COUNTS:  NO X-ray pending  TOURNIQUET:  * No tourniquets in log *  DICTATION: .Dragon Dictation  PLAN OF CARE: Admit to inpatient   PATIENT DISPOSITION:  PACU - hemodynamically stable.   Delay start of Pharmacological VTE agent (>24hrs) due to surgical blood loss or risk of bleeding: yes   Findings:  @BABYSEXEBC @ infant,  APGAR (1 MIN): 7   APGAR (5 MINS): 9   APGAR (10 MINS):   Normal uterus, tubes and ovaries, normal placenta. Meconium-stained fluid, umbilical cord prolapsing beyond the fetal vertex, no evidence of bladder laceration Cord pH 7.13  EBL: Per anesthesia Antibiotics:   2g Ancef Complications: none  Indications: This is a 29 y.o. year-old, G2 P1  At 2458w4d admitted for new onset gestational hypertension. Patient with history of primary cesarean section for breech. Also with gestational hypertension in her prior pregnancy. Patient has been asymptomatic through this pregnancy and presented for routine obstetric visit today with blood pressure of 140 overnight D 2. The remainder of the evaluation was negative for preeclampsia but given her history of gestational hypertension, her elevated blood pressure and her gestational age  greater than 38 weeks decision was made to proceed with induction of labor. Her cervix was 2 cm with a fetal vertex high in the pelvis. Patient was admitted for Pitocin induction of labor. Patient had slow progress to 3 cm over about 5 hours until she entered active phase. At this point she had an epidural and had good progression to about 9.5 cm. Fetal strip had been remained reactive until the last hour. Over the last hour patient had progressive decelerations from early to variable to late decelerations. Moderate variability with accelerations between these decelerations. Over the last 15 minutes patient had about 3 prolonged decelerations to the 90s for about 3-4 minutes each. Given the patient had made good progress on Pitocin, was comfortable with the epidural and to further evaluate the decelerations decision was made for artificial rupture of membranes. Light meconium was noted. Fetal vertex was at 0 station. After rupture of membranes patient had a short decelerations with recovery. Pitocin was off at this time and position changes were in effect. Patient then had another decelerations to the 90s and after no recovery at 3-4 minutes pelvic exam was performed to help elevate the fetal vertex and allow fetal recovery. During this process of elevating the fetal head a 4 cm loop of umbilical cord prolapsed beyond the fetal vertex. Fetal heart rate dropped into the 60s and decision made to proceed with stat cesarean section. This obvious cord prolapse at the time of elevation of the fetal vertex made it clear that the prior decelerations were due to a preceding occult cord prolapse that began prior to rupture of membranes.  A labor nurse  relieved me and elevated the fetal vertex off the umbilical cord as patient was transferred to the operating room. Risks benefits and alternatives of the procedure were discussed with the patient and her husband who is translating who agreed to proceed.   Procedure:  After  informed consent was obtained verbally the patient was taken to the operating room where a timeout was completed.  Epidural anesthesia was in effect but felt to be inadequate by anesthesia staff and preparations were being made for general anesthesia. Initially inadequate IV access was found but this was addressed by nursing and anesthesia staff. Given the urgency of the cesarean section due to cord prolapse and fetal distress, a Betadine prep was administered. Patient was also given 2 g of Ancef.   She was draped in the normal sterile fashion in dorsal supine position with a leftward tilt.  A foley catheter was in place.  A Pfannenstiel skin incision was made 2 cm above the pubic symphysis in the midline with the scalpel. The scalpel was used to dissect through the subcutaneous fat and to incise the fascia. Scissors were used to extend the fascial incision. Coker clamps were placed on the inferior aspect of the fascial incision which was elevated up and the underlying rectus muscles were dissected off sharply. The superior aspect of the fascial incision was grasped with the Coker clamps elevated up and the underlying rectus muscles were dissected off sharply.  The peritoneum was entered bluntly and extended bluntly. The peritoneal incision was extended superiorly and inferiorly with brief visualization of the bladder. The bladder was felt to be somewhat high on the uterus. The bladder blade was inserted and with care to avoid the bladder , the lower segment of the uterus was incised sharply with the scalpel and extended  bluntly in the cephalo-caudal fashion. The infant was grasped, brought to the incision,  rotated and the infant was delivered with fundal pressure. The cord was clamped and cut. The infant was handed off to the waiting pediatrician. The placenta was expressed and the nurse obtained cord pH and cord blood. The uterus was exteriorized. The uterus was cleared of all clots and debris. The uterine  incision was repaired with 0 Vicryl in a running locked fashion.  A second layer of the same suture was used in an imbricating fashion to obtain excellent hemostasis. Further evaluation of the bladder was done and no obvious laceration was noted. The bladder was then backfilled with 300 cc of sterile milk and again no leak was noted. The uterus was then returned to the abdomen, the gutters were cleared of all clots and debris. The uterine incision was reinspected and found to be hemostatic. The peritoneum was grasped and closed with 2-0 chromic in a running fashion. The cut muscle edges and the underside of the fascia were inspected and found to be hemostatic. The fascia was closed with 0 Vicryl in a single layer The subcutaneous tissue was irrigated. Scarpa's layer was closed with a 2-0 plain gut suture. The skin was closed with staples. Staples were used to expedite closure as patient was waking from her general anesthesia.  The patient tolerated the procedure well. Due to the urgency of the cesarean section a formal count was not done prior to the case and a plain film was done at the conclusion of the case . The patient was taken to the recovery room in a stable condition.  Uchechi Denison A. 04/29/2015 4:37 AM

## 2015-04-29 NOTE — Transfer of Care (Signed)
Immediate Anesthesia Transfer of Care Note  Patient: Melissa [name]  Procedure(s) Performed: Procedure(s): CESAREAN SECTION (N/A)  Patient Location: PACU  Anesthesia Type:General and Epidural  Level of Consciousness: awake, alert , oriented and patient cooperative  Airway & Oxygen Therapy: Patient Spontanous Breathing and Patient connected to nasal cannula oxygen  Post-op Assessment: Report given to RN and Post -op Vital signs reviewed and stable  Post vital signs: Reviewed and stable  Last Vitals:  Filed Vitals:   04/28/15 2301  BP:   Pulse:   Temp: 36.5 C  Resp: 20    Complications: No apparent anesthesia complications

## 2015-04-29 NOTE — Progress Notes (Signed)
Stat C-section performed with no counts prior to incision. Abdominal Xray taken post c-section.  Dr. Grace IsaacWatts radiologist called and approved xray post stat c-section.  Patient transported to PACU for recovery.

## 2015-04-29 NOTE — Anesthesia Postprocedure Evaluation (Signed)
  Anesthesia Post-op Note  Patient: Melissa [name]  Procedure(s) Performed: Procedure(s): CESAREAN SECTION (N/A)  Patient Location: PACU  Anesthesia Type:General and Epidural  Level of Consciousness: awake  Airway and Oxygen Therapy: Patient Spontanous Breathing  Post-op Pain: mild  Post-op Assessment: Post-op Vital signs reviewed, Patient's Cardiovascular Status Stable, Respiratory Function Stable, Patent Airway, No signs of Nausea or vomiting and Pain level controlled  Post-op Vital Signs: Reviewed and stable  Last Vitals:  Filed Vitals:   04/29/15 0810  BP: 114/56  Pulse: 62  Temp: 37 C  Resp: 18    Complications: No apparent anesthesia complications

## 2015-04-29 NOTE — Anesthesia Postprocedure Evaluation (Signed)
  Anesthesia Post-op Note  Patient: Melissa [name]  Procedure(s) Performed: * No procedures listed *  Patient Location: Mother/Baby  Anesthesia Type:Epidural  Level of Consciousness: awake  Airway and Oxygen Therapy: Patient Spontanous Breathing  Post-op Pain: mild  Post-op Assessment: Patient's Cardiovascular Status Stable and Respiratory Function Stable  Post-op Vital Signs: stable  Last Vitals:  Filed Vitals:   04/29/15 1005  BP: 103/45  Pulse: 71  Temp:   Resp: 18    Complications: No apparent anesthesia complications

## 2015-04-30 LAB — CBC
HEMATOCRIT: 30 % — AB (ref 36.0–46.0)
Hemoglobin: 9.9 g/dL — ABNORMAL LOW (ref 12.0–15.0)
MCH: 28 pg (ref 26.0–34.0)
MCHC: 33 g/dL (ref 30.0–36.0)
MCV: 85 fL (ref 78.0–100.0)
Platelets: 146 10*3/uL — ABNORMAL LOW (ref 150–400)
RBC: 3.53 MIL/uL — ABNORMAL LOW (ref 3.87–5.11)
RDW: 15.4 % (ref 11.5–15.5)
WBC: 7.4 10*3/uL (ref 4.0–10.5)

## 2015-04-30 MED ORDER — IBUPROFEN 800 MG PO TABS
800.0000 mg | ORAL_TABLET | Freq: Three times a day (TID) | ORAL | Status: DC
  Administered 2015-04-30 – 2015-05-03 (×8): 800 mg via ORAL
  Filled 2015-04-30 (×8): qty 1

## 2015-04-30 NOTE — Progress Notes (Signed)
POSTOPERATIVE DAY # 1 S/P CS   S:        Speaks little [place] - spouse translating              Reports pain with movement             Tolerating po intake / no nausea / no vomiting / no flatus / no BM             Bleeding is light             Pain controlled with motrin and percocet             Up ad lib / ambulatory/ voiding QS  Newborn breast feeding     O:  VS: BP 111/47 mmHg  Pulse 69  Temp(Src) 98.6 F (37 C) (Axillary)  Resp 18  Ht 5\' 4"  (1.626 m)  Wt 102.059 kg (225 lb)  BMI 38.60 kg/m2  SpO2 99%  Breastfeeding? Unknown   LABS:               Recent Labs  04/29/15 0600 04/30/15 0546  WBC 12.1* 7.4  HGB 10.7* 9.9*  PLT 197 146*               Bloodtype: --/--/O POS, O POS (05/26 1610)  Rubella: Equivocal (11/06 0000)                                             I&O: Intake/Output      05/27 0701 - 05/28 0700 05/28 0701 - 05/29 0700   P.O. 480    I.V. (mL/kg) 1000 (9.8)    Total Intake(mL/kg) 1480 (14.5)    Urine (mL/kg/hr) 2400 (1)    Blood     Total Output 2400     Net -920                       Physical Exam:             Alert and Oriented X3  Lungs: Clear and unlabored  Heart: regular rate and rhythm / no mumurs  Abdomen: soft, non-tender, non-distended, hypoactive BS             Fundus: firm, non-tender, Ueven             Dressing intact honeycomb              Incision:  no erythema / no ecchymosis / no drainage  Extremities: 1+ pedal edema, no calf pain or tenderness  A:        POD # 1 S/P CS            ABL anemia  P:        Routine postoperative care              Increase water intake today - movement will help with pain - will increase motrin dose     Melissa MikeBAILEY, Melissa [name] CNM, MSN, FACNM 04/30/2015, 10:01 AM

## 2015-04-30 NOTE — Lactation Note (Signed)
This note was copied from the chart of Melissa [name]. Lactation Consultation Note  Patient Name: Melissa [name]: 04/30/2015 Reason for consult: Follow-up assessment   Follow-up with patient at 39 hours.  4% weight loss in <24 hours life.   Mom is a P2 with experience breastfeeding 1 previous children, but reports having to supplement.  Mom is Arabic and can understand minimal [place].  Dad not in room at time of visit to translate.   Infant has breastfed x6 (15-30 min) + attempts x5 (0-5 min) in past 24 hours; voids-3 in 24 hours/ 4 life; stools-3 in 24 hours/ 4 life. RN concerned about previous feedings today.  RN noted chomping at breast, frantic behavior, unable to hand express any colostrum.  Difficulty latching to left side.  Some spacing noted between breasts. Infant was circumcised today at 1300 ~6 hours ago.  Infant asleep on mom's bed when LC entered.  LC asked mom to latch infant to left side.   Mom was in laid-back position.  LC placed infant in sitting-football-laid-back position.  Infant began to suckle with slow but consistent rhythm.  Very few reminders needed to keep him sucking.  LS-8.  Several swallows heard on left side.  RN came in during feeding and stated he was feeding better than previously today. Infant fed for 20 minutes and then came off.    LC encouraged latching infant to right breast.  Only 1-2 swallows heard during feeding from right side.  Infant fed for an additional 20 minutes and came off showing feeding cues (mouthing) after 40 minutes of breastfeeding. LC still unable to hand express colostrum.  LC left to get Lactation brochure and call language line for teaching.  When LC returned infant was screaming and showing feeding cues.   Mom re-latched infant.  LC spoke to mom using interpreter.  Mom stated she feels like infant is not getting enough milk and was concerned.  Discussed starting some formula.  Mom agreed.  Discussed that formula  could be used until her milk supply increased to the point that she would not have to use. LC started 5-French feeding tube SNS with 7 ml formula.  Infant breastfed using SNS easily, burped, and was content at end of feeding.   LC instructed mom (via interpreter) to continue feeding with feeding cues and using SNS with formula based on guidelines given.   LC circled amount for mom to use throughout the night on guideline sheet.  Also gave curved-tip syringe and spoons.  Demonstrated to mom how to spoon feed.   Report given to RN.     Maternal Data Has patient been taught Hand Expression?: Yes Does the patient have breastfeeding experience prior to this delivery?: Yes  Feeding Feeding Type: Breast Fed  LATCH Score/Interventions Latch: Grasps breast easily, tongue down, lips flanged, rhythmical sucking.  Audible Swallowing: A few with stimulation Intervention(s): Skin to skin Intervention(s): Alternate breast massage  Type of Nipple: Everted at rest and after stimulation  Comfort (Breast/Nipple): Soft / non-tender     Hold (Positioning): Assistance needed to correctly position infant at breast and maintain latch. Intervention(s): Position options;Skin to skin  LATCH Score: 8  Lactation Tools Discussed/Used     Consult Status Consult Status: Follow-up [name]: 05/01/15 Follow-up type: In-patient    Melissa [name] 04/30/2015, 6:56 PM

## 2015-05-01 MED ORDER — MEASLES, MUMPS & RUBELLA VAC ~~LOC~~ INJ
0.5000 mL | INJECTION | Freq: Once | SUBCUTANEOUS | Status: AC
  Administered 2015-05-03: 0.5 mL via SUBCUTANEOUS
  Filled 2015-05-01 (×2): qty 0.5

## 2015-05-01 NOTE — Progress Notes (Addendum)
POSTOPERATIVE DAY # 2 S/P CS - cord prolapse  S:         Reports feeling ok per spouse translating             Tolerating po intake / no nausea / no vomiting / no flatus / no BM             Bleeding is light             Pain controlled with motrin and percocet             Up ad lib / ambulatory/ voiding QS  Newborn breast-feeding & still working with latch - lactation assist as needed             Circumcision done yesterday   O:  VS: BP 117/69 mmHg  Pulse 58  Temp(Src) 98.4 F (36.9 C) (Oral)  Resp 17  Ht 5' 4" (1.626 m)  Wt 102.059 kg (225 lb)  BMI 38.60 kg/m2  SpO2 99%  Breastfeeding? Unknown   LABS:               Recent Labs  04/29/15 0600 04/30/15 0546  WBC 12.1* 7.4  HGB 10.7* 9.9*  PLT 197 146*               Bloodtype: --/--/O POS, O POS (05/26 1610)  Rubella: Equivocal (11/06 0000)  - needs booster prior to DC                          tdap current                  Physical Exam:             Alert and Oriented X3  Lungs: Clear and unlabored  Heart: regular rate and rhythm / no mumurs  Abdomen: soft, non-tender, non-distended, hypoactive BS             Fundus: firm, non-tender, Ueven             Dressing intact honeycomb              Incision:  approximated with staple / no erythema / no ecchymosis / no drainage  Extremities: 2+ dependent edema, no calf pain or tenderness, negative Homans  A:        POD # 2 S/P CS            ABL anemia             P:        Routine postoperative care              Iron and magnesium daily             MMR prior to [place], [place], MSN, [facility] 05/01/2015, 9:33 AM

## 2015-05-01 NOTE — Progress Notes (Signed)
rn IN ROOM AND HUSBAND IN ROOM NOW. HUSBAND SIGNED PAPER FOR INTERPRETING TO PT. PER PT VERBAL OK.

## 2015-05-02 ENCOUNTER — Encounter (HOSPITAL_COMMUNITY): Payer: Self-pay | Admitting: Obstetrics

## 2015-05-02 NOTE — Lactation Note (Signed)
This note was copied from the chart of Boy Naveyah Reesor. Lactation Consultation Note Follow up visit at 86 hours of age.  FOB offering bottle to baby after a 35 minute breastfeeding, due to baby being sleepy.  Encouraged parents to only allow baby 20 minutes at the breast, STS, and then 10 minutes to finish feeding from bottle for supplementation.  Encouraged parents to work on limiting total feeding time to 30 minutes, to further conserve calories.    Mom is preparing a warm water basin to massage breasts prior to pumping, FOB interprets for mom and reports this is for mom's comfort.  Discussed use of ICE for engorgement as needed, mom declines at this time.   Discussed with mom hand expressing her breast after pumping for 15 minutes to increase amount of EBM.  Demonstrated and observed return demonstration and encouraged HE as a good way to empty breasts.  Discussed with FOB allowing baby half of supplement and burping baby.  FOB to encourage baby to stay awake and finish supplementation offered.  Instructed FOB to allow lips to flange on bottle nipple as would at the breast.  FOB supportive and both deny further questions at this time.  [facility] if baby is not staying awake for feedings and finishing supplementation.    Patient Name: Boy Brigid ReMaha Demers ZOXWR'UToday's Date: 05/02/2015 Reason for consult: Follow-up assessment;Infant weight loss   Maternal Data    Feeding Feeding Type: Breast Fed  LATCH Score/Interventions                      Lactation Tools Discussed/Used     Consult Status Consult Status: Follow-up Date: 05/03/15 Follow-up type: In-patient    Shoptaw, Arvella MerlesJana Lynn 05/02/2015, 6:15 PM

## 2015-05-02 NOTE — Lactation Note (Signed)
This note was copied from the chart of Boy Shikita Basaldua. Lactation Consultation Note  Patient Name: Boy Brigid ReMaha Lans[place] YNWGN'FToday's Date: 05/02/2015 Reason for consult: Follow-up assessment;Infant weight loss;Infant < 6lbs Mom has only pumped 2 times today, reports not getting any EBM with pumping. FOB present to interpret. Mom c/o of breasts feeling more firm, heavy. LC notes some firmness to palpation. Baby has been to the breast numerous times. Mom latched baby at this visit, baby demonstrates some suckling bursts but no audible swallows noted. No supplements given today. Weighed baby at this visit, baby now at about 10% weight loss at 5 lb. 10 oz/2550 gm tonight. Discussed need to supplement with parents and they do agree. Parents prefer Halal formula. Per [employer] Enfamil Gentle ease is Halal which is available from NICU. Plan discussed with parents is for Mom to BF with each feeding 15-20 minutes both breasts each feeding at least every 3 hours, then Mom to post pump for 15 minutes, FOB to give supplement per guidelines starting with 20-25 ml tonight either EBM or formula. To conserve baby's calories with feedings advised to use bottle tonight for supplement instead of feeding tube, parents agree. Baby's I/O adequate with 4 voids/3 stools in past 24 hours. RN aware of plan and will help FOB give 1st bottle. F/U with lactation tomorrow.   Maternal Data    Feeding Feeding Type: Breast Fed Length of feed: 25 min (off/on)  LATCH Score/Interventions Latch: Grasps breast easily, tongue down, lips flanged, rhythmical sucking.  Audible Swallowing: None  Type of Nipple: Everted at rest and after stimulation  Comfort (Breast/Nipple): Filling, red/small blisters or bruises, mild/mod discomfort  Problem noted: Filling  Hold (Positioning): Assistance needed to correctly position infant at breast and maintain latch.  LATCH Score: 6  Lactation Tools Discussed/Used Tools: Pump Breast  pump type: Double-Electric Breast Pump   Consult Status Consult Status: Follow-up Date: 05/02/15 Follow-up type: In-patient    Alfred LevinsGranger, Mckenna Gamm Ann 05/02/2015, 12:30 AM

## 2015-05-02 NOTE — Progress Notes (Signed)
POSTOPERATIVE DAY # 3 S/P CS - cord prolapse   S:         Reports feeling ok per spouse translating             Tolerating po intake / no nausea / no vomiting /  no BM             Bleeding is light             Pain controlled with motrin and percocet             Up ad lib / ambulatory/ voiding QS  Newborn breast feeding - newborn weight loss to 11% - no discharge today                      Difficulty latch and feeding   O:  VS: BP 136/64 mmHg  Pulse 70  Temp(Src) 97.9 F (36.6 C) (Oral)  Resp 18  Ht _0  (1.626 m)  Wt 102.059 kg (225 lb)  BMI 38.60 kg/m2  SpO2 100%  Breastfeeding? Unknown   LABS:               Recent Labs  04/30/15 0546  WBC 7.4  HGB 9.9*  PLT 146*               Bloodtype: --/--/O POS, O POS (05/26 1610)  Rubella: Equivocal (11/06 0000)  - MMR recommended / offered                               Physical Exam:             Alert and Oriented X3  Abdomen: soft, non-tender, non-distended active BS             Fundus: firm, non-tender, U-1             Dressing intact honeycomb              Incision:  approximated with staple / no erythema / no ecchymosis / no drainage  Extremities: trace edema, no calf pain or tenderness, negative Homans  A:        POD # 3 S/P CS            mild ABL anemia            difficulty with lactation / newborn weight loss            language barrier  P:        Routine postoperative care              Continue iron and magnesium             Need to increase water intake   Inpatient care until am with increased lactation support - difficulties in communication due to language barrier  / unable to communicate understanding of rooming-in & need to obtain medication to stay in hospital / lactation support and assist needed throughout the day today.             Anticipate DC tomorrow - staple removal tomorrow     Artelia Laroche CNM, MSN, [facility] 05/02/2015, 9:56 AM

## 2015-05-03 DIAGNOSIS — E039 Hypothyroidism, unspecified: Secondary | ICD-10-CM | POA: Diagnosis present

## 2015-05-03 DIAGNOSIS — O139 Gestational [pregnancy-induced] hypertension without significant proteinuria, unspecified trimester: Secondary | ICD-10-CM | POA: Diagnosis present

## 2015-05-03 DIAGNOSIS — O99892 Other specified diseases and conditions complicating childbirth: Secondary | ICD-10-CM

## 2015-05-03 DIAGNOSIS — O9989 Other specified diseases and conditions complicating pregnancy, childbirth and the puerperium: Secondary | ICD-10-CM

## 2015-05-03 DIAGNOSIS — D62 Acute posthemorrhagic anemia: Secondary | ICD-10-CM | POA: Diagnosis not present

## 2015-05-03 DIAGNOSIS — Z2839 Other underimmunization status: Secondary | ICD-10-CM

## 2015-05-03 DIAGNOSIS — Z283 Underimmunization status: Secondary | ICD-10-CM

## 2015-05-03 MED ORDER — IBUPROFEN 800 MG PO TABS: 800.0000 mg | ORAL_TABLET | Freq: Three times a day (TID) | ORAL | Status: DC

## 2015-05-03 MED ORDER — POLYSACCHARIDE IRON COMPLEX 150 MG PO CAPS
150.0000 mg | ORAL_CAPSULE | Freq: Every day | ORAL | Status: DC
  Administered 2015-05-03: 150 mg via ORAL
  Filled 2015-05-03: qty 1

## 2015-05-03 MED ORDER — OXYCODONE-ACETAMINOPHEN 5-325 MG PO TABS: 1.0000 | ORAL_TABLET | ORAL | Status: DC | PRN

## 2015-05-03 MED ORDER — POLYSACCHARIDE IRON COMPLEX 150 MG PO CAPS: 150.0000 mg | ORAL_CAPSULE | Freq: Every day | ORAL | Status: DC

## 2015-05-03 NOTE — Progress Notes (Signed)
Staples removed, steri-strips applied

## 2015-05-03 NOTE — Discharge Summary (Signed)
DISCHARGE SUMMARY:  Patient ID: Melissa [name] MRN: [id number] DOB/AGE: 1986-11-11 29 y.o.  Admit date: 04/28/2015 Admission Diagnoses: 38.[redacted] weeks gestation, gestational HTN   Discharge date: 05/03/2015 Discharge Diagnoses: S/P repeat C/S on 04/29/15, ABL anemia, rubella non-immune, hypothyroidism        Prenatal history: G2P2001   EDC: 05/09/2015, by Patient Reported  [facility]atal [facility] at [facility] since [redacted] wks gestation. Primary provider: Dr. Ernestina Penna Prenatal course complicated by hypothyroidism, previous CS, gestational HTN, GERD  Prenatal labs: ABO, Rh: --/--/O POS, O POS (05/26 1610)  Antibody: NEG (05/26 1610) Rubella:   Non-immune RPR: Non Reactive (05/26 1610)  HBsAg: Negative (11/06 0000)  HIV: Non-reactive (11/06 0000)  GBS: Negative (05/26 0000)  GTT: 136, passed 3 hr  Medical / Surgical History :  Past medical history:  Past Medical History  Diagnosis Date  . Hyperthyroidism     Past surgical history:  Past Surgical History  Procedure Laterality Date  . Cesarean section    . Eye surgery      Lasik  . Cesarean section N/A 04/29/2015    Procedure: CESAREAN SECTION;  Surgeon: Noland Fordyce, MD;  Location: [facility];  Service: Obstetrics;  Laterality: N/A;     Medications on Admission: Prescriptions prior to admission  Medication Sig Dispense Refill Last Dose  . levothyroxine (SYNTHROID, LEVOTHROID) 88 MCG tablet Take 88 mcg by mouth daily before breakfast.   04/27/2015 at Unknown time  . Prenatal Vit-Fe Fumarate-FA (PRENATAL MULTIVITAMIN) TABS tablet Take 1 tablet by mouth at bedtime.   04/27/2015 at Unknown time    Allergies: Review of patient's allergies indicates no known allergies.   Intrapartum Course:  Admitted for IOL, Pitocin, epidural, AROM, cord prolapse, stat CS.   Postpartum Course: Complicated by ABL anemia and lactation difficulties.  Physical Exam:   VSS: Blood pressure 139/74, pulse 69, temperature 98.8 F (37.1 C),  temperature source Oral, resp. rate 18, height  (1.626 m), weight 102.059 kg (225 lb), SpO2 98 %, unknown if currently breastfeeding.  General: alert and oriented x3 Heart: RRR Lungs: CTA bilaterally GI: soft, non-tender, non-distended, BS x4 Lochia: small Uterus: firm below umbilicus Incision: well approximated; honeycomb dressing-no significant erythema, drainage, or edema Extremities: no edema, Homans neg   Newborn Data Live born female on 04/29/15 Birth Weight: 6 lb 4.7 oz (2855 g) APGAR: 7, 9  See operative report for further details  Home with mother.  Discharge Instructions:  Wound Care: keep clean and dry / remove honeycomb POD 6 Postpartum Instructions: [facility] discharge booklet - instructions reviewed Medications:    Medication List    TAKE these medications        ibuprofen 800 MG tablet  Commonly known as:  ADVIL,MOTRIN  Take 1 tablet (800 mg total) by mouth every 8 (eight) hours.     iron polysaccharides 150 MG capsule  Commonly known as:  NIFEREX  Take 1 capsule (150 mg total) by mouth daily.     levothyroxine 88 MCG tablet  Commonly known as:  SYNTHROID, LEVOTHROID  Take 88 mcg by mouth daily before breakfast.     oxyCODONE-acetaminophen 5-325 MG per tablet  Commonly known as:  PERCOCET/ROXICET  Take 1-2 tablets by mouth every 4 (four) hours as needed (for pain scale greater than 7).     prenatal multivitamin Tabs tablet  Take 1 tablet by mouth at bedtime.            Follow-up Information    Follow [facility] with [facility]  A[facility] MD. Schedule an appointment as soon as possible for a visit in 6 weeks.   Specialty:  Obstetrics and Gynecology   Contact information:   [address] Severe[address] [place] [id number] [phone]       [facility]  Signed: Donette LarryBHAMBRI, Sameeha Rockefeller, N MSN, CNM 05/03/2015, 2:53 PM

## 2015-05-03 NOTE — Lactation Note (Signed)
This note was copied from the chart of [place]. Lactation Consultation Note FOB asked information about pump rental and purchasing DEBP. Gave information and didn't wish to purchase or rent a pump.  demosntated how to use hand pump from DEBP kit. Discussed w/dad engorgement and I&O.  Discussed over feeding and cutting back formula. Discussed supply and demand.  Patient Name: Melissa [name] [id number] Date: 05/03/2015 Reason for consult: Follow-up assessment;Breast/nipple pain   Maternal Data    Feeding Feeding Type: Breast Fed Length of feed: 15 min  LATCH Score/Interventions    Intervention(s): Alternate breast massage;Hand expression  Type of Nipple: Everted at rest and after stimulation  Comfort (Breast/Nipple): Filling, red/small blisters or bruises, mild/mod discomfort  Problem noted: Filling Interventions (Filling): Massage;Frequent nursing;Double electric pump;Firm support        Lactation Tools Discussed/Used Tools: Pump Breast pump type: Double-Electric Breast Pump   Consult Status Consult Status: Complete Date: 05/03/15    Theodoro Kalata 05/03/2015, 11:52 AM

## 2015-05-03 NOTE — Lactation Note (Addendum)
This note was copied from the chart of Melissa Martie Prom. Lactation Consultation W/international [employer] interpreter, reviewed engorgement, treatment and prevention. Noted moms breast are filling and hardened up to shelf. Breast massage, ICE applied, encouraged support bra, supply and demand reviewed. Mom has DEBP in room and is pumping 15 min. Every three hours to encouraged milk supply. Explained she didn't need to do that for milk supply now d/t her milk is in.  Mom has been putting baby to breast then giving formula. demonstrated mom her milk is in by hand expression and stressed no need to give formula since her milk is in. Mom was stating baby has been gagging. Explained over feeding d/t milk supply in and then supplementing, how small baby''s tummy is. And normal amount according to hours of age. Mom has good everted nipples, no bruising noted, encouraged wide open mouth when latching.  Discussed empting breast and not letting milk back up in breast. Cont. To apply ICE at intervals and massage during BF to assist in expressing milk out of breast. Patient Name: Melissa [name] WUJWJ'XToday's Date: 05/03/2015 Reason for consult: Follow-up assessment;Breast/nipple pain   Maternal Data    Feeding Feeding Type: Breast Fed Length of feed: 15 min  LATCH Score/Interventions    Intervention(s): Alternate breast massage;Hand expression  Type of Nipple: Everted at rest and after stimulation  Comfort (Breast/Nipple): Filling, red/small blisters or bruises, mild/mod discomfort  Problem noted: Filling Interventions (Filling): Massage;Frequent nursing;Double electric pump;Firm support        Lactation Tools Discussed/Used Tools: Pump Breast pump type: Double-Electric Breast Pump   Consult Status Consult Status: Complete Date: 05/03/15    Charyl DancerCARVER, [name] 05/03/2015, 10:23 AM

## 2015-05-03 NOTE — Progress Notes (Signed)
Language line called and used by pediatrican, midwife, and RN

## 2015-05-03 NOTE — Progress Notes (Signed)
POD #4  Subjective: Via [employer] farm manager pt reports feeling ok, some incisional pain/ Pain controlled with Motrin and Percocet Tolerating po/Voiding without problems/ No n/v/ Flatus present Activity: ad lib Bleeding is light Newborn info:  Information for the patient's newborn:  Melissa, [name] [[id number]]  female Circumcision: done/ Feeding: breast and bottle-baby feeding better   Objective: VS: VS:  Filed Vitals:   05/01/15 1807 05/02/15 0543 05/02/15 1827 05/03/15 0628  BP: 125/56 136/64 148/70 139/74  Pulse: 69 70 64 69  Temp: 98.1 F (36.7 C) 97.9 F (36.6 C) 98.3 F (36.8 C) 98.8 F (37.1 C)  TempSrc: Oral Oral Oral Oral  Resp: 18 18 18 18   Height:      Weight:      SpO2:  100%  98%    I&O: Intake/Output    None     LABS: No results for input(s): WBC, HGB, PLT in the last 72 hours. Blood type: --/--/O POS, O POS (05/26 1610) Rubella: Equivocal (11/06 0000)           Physical Exam:  General: alert and cooperative CV: Regular rate and rhythm Resp: CTA bilaterally Abdomen: soft, nontender, normal bowel sounds Incision: Covered with Tegaderm and honeycomb dressing; no significant drainage, edema, bruising, or erythema; well approximated with staples Uterine Fundus: firm, below umbilicus, nontender Lochia: minimal Ext: edema trace BL pedal and Homans sign is negative, no sign of DVT    Assessment: POD # 4/ G2P2001/ S/P C/Section d/t cord prolapse Gestational HTN, delivered-stable ABL anemia Rubella non-immune Doing well and stable for discharge home  Plan: MMR before discharge Discharge home RX's: Ibuprofen 660m po Q 6 hrs prn pain #30 Refill x 1 Percocet 5/325 1 - 2 tabs po every 4 hrs prn pain #30 Refill x 0 Niferex 1565mpo QD #30 Refill x 1 Follow [facility] wks [facility]    Signed: BHJulianne HandlerN,Melissa GingerMSN, CNM 05/03/2015, 9:49 AM

## 2015-06-29 ENCOUNTER — Emergency Department (HOSPITAL_BASED_OUTPATIENT_CLINIC_OR_DEPARTMENT_OTHER): Payer: PPO

## 2015-06-29 ENCOUNTER — Encounter (HOSPITAL_BASED_OUTPATIENT_CLINIC_OR_DEPARTMENT_OTHER): Payer: Self-pay | Admitting: *Deleted

## 2015-06-29 ENCOUNTER — Emergency Department (HOSPITAL_BASED_OUTPATIENT_CLINIC_OR_DEPARTMENT_OTHER)
Admission: EM | Admit: 2015-06-29 | Discharge: 2015-06-30 | Disposition: A | Discharge status: Home / Self Care | Payer: PPO | Attending: Emergency Medicine | Admitting: Emergency Medicine

## 2015-06-29 DIAGNOSIS — Z79899 Other long term (current) drug therapy: Secondary | ICD-10-CM | POA: Insufficient documentation

## 2015-06-29 DIAGNOSIS — R102 Pelvic and perineal pain: Secondary | ICD-10-CM

## 2015-06-29 DIAGNOSIS — R1030 Lower abdominal pain, unspecified: Secondary | ICD-10-CM | POA: Diagnosis present

## 2015-06-29 DIAGNOSIS — E0590 Thyrotoxicosis, unspecified without thyrotoxic crisis or storm: Secondary | ICD-10-CM | POA: Diagnosis not present

## 2015-06-29 DIAGNOSIS — N939 Abnormal uterine and vaginal bleeding, unspecified: Secondary | ICD-10-CM | POA: Insufficient documentation

## 2015-06-29 DIAGNOSIS — Z3202 Encounter for pregnancy test, result negative: Secondary | ICD-10-CM | POA: Diagnosis not present

## 2015-06-29 NOTE — ED Notes (Signed)
Pt c/o abdominal pain radiating to her back and vaginal bleeding that began yesterday. The bleeding became heavier today.

## 2015-06-29 NOTE — ED Provider Notes (Signed)
CSN: [id number]     Arrival date & time 06/29/15  2255 History  This chart was scribed for Geoffery Lyons, MD by Lyndel Safe, ED Scribe. This patient was seen in room MH01/MH01 and the patient's care was started 11:38 PM.    Chief Complaint  Patient presents with  . Abdominal Pain   The history is provided by the spouse. History limited by: Arabic  No language interpreter was used.    HPI Comments: Melissa [name] is a 29 y.o. female, with a PMhx of hypothyroidism, who presents to the Emergency Department complaining of sudden onset, constant, moderate suprapubic abdominal pain that radiates to her lower back onset 1 day ago. Per family member, pt has associated vaginal bleeding that is heavier than her normal menses. Pt recently started taking oral estrogen medication. She has had 2 cesarean sections, the most recent being 2 months ago on 04/29/2015. Pt is currently breast feeding. Denies fever or dysuria.   Past Medical History  Diagnosis Date  . Hyperthyroidism    Past Surgical History  Procedure Laterality Date  . Cesarean section    . Eye surgery      Lasik  . Cesarean section N/A 04/29/2015    Procedure: CESAREAN SECTION;  Surgeon: Noland Fordyce, MD;  Location: [facility];  Service: Obstetrics;  Laterality: N/A;   No family history on file. History  Substance Use Topics  . Smoking status: Never Smoker   . Smokeless tobacco: Never Used  . Alcohol Use: No   OB History    Gravida Para Term Preterm AB TAB SAB Ectopic Multiple Living   0 0 0 0 0 0 1     Review of Systems  Constitutional: Negative for fever.  Gastrointestinal: Positive for abdominal pain.  Genitourinary: Positive for vaginal bleeding. Negative for dysuria.  Musculoskeletal: Positive for back pain.   Allergies  Review of patient's allergies indicates no known allergies.  Home Medications   Prior to Admission medications   Medication Sig Start Date End Date Taking? Authorizing Provider  ibuprofen  (ADVIL,MOTRIN) 800 MG tablet Take 1 tablet (800 mg total) by mouth every 8 (eight) hours. 05/03/15   Lawernce Pitts, CNM  iron polysaccharides (NIFEREX) 150 MG capsule Take 1 capsule (150 mg total) by mouth daily. 05/03/15   Lawernce Pitts, CNM  levothyroxine (SYNTHROID, LEVOTHROID) 88 MCG tablet Take 88 mcg by mouth daily before breakfast.    Historical Provider, MD  oxyCODONE-acetaminophen (PERCOCET/ROXICET) 5-325 MG per tablet Take 1-2 tablets by mouth every 4 (four) hours as needed (for pain scale greater than 7). 05/03/15   Lawernce Pitts, CNM  Prenatal Vit-Fe Fumarate-FA (PRENATAL MULTIVITAMIN) TABS tablet Take 1 tablet by mouth at bedtime.    Historical Provider, MD   BP 120/72 mmHg  Pulse 64  Temp(Src) 98.4 F (36.9 C) (Oral)  Resp 18  Wt 222 lb 8 oz (100.925 kg)  SpO2 100% Physical Exam  Constitutional: She appears well-developed and well-nourished. No distress.  HENT:  Head: Normocephalic.  Eyes: Conjunctivae are normal. Right eye exhibits no discharge. Left eye exhibits no discharge. No scleral icterus.  Neck: No JVD present.  Cardiovascular: Normal rate, regular rhythm and normal heart sounds.   Pulmonary/Chest: Effort normal and breath sounds normal. No respiratory distress.  Abdominal: Soft. Bowel sounds are normal. She exhibits no distension and no mass. There is tenderness. There is no rebound and no guarding.  Mild suprapubic abdominal TTP.   Neurological: She is alert. Coordination normal.  Skin: Skin is warm. No rash noted. No erythema. No pallor.  Psychiatric: She has a normal mood and affect. Her behavior is normal.  Nursing note and vitals reviewed.   ED Course  Procedures  DIAGNOSTIC STUDIES: Oxygen Saturation is 100% on RA, normal by my interpretation.    COORDINATION OF CARE: 11:47 PM Discussed treatment plan which includes to order urinalysis and pelvic US with pt. Pt acknowledges and agrees to plan.   Labs Review Labs Reviewed - No data to  display  Imaging Review No results found.   EKG Interpretation None      MDM   Final diagnoses:  None    Patient is a 29 year old female with history of recent cesarean section. She is Arabic and does not speak [place]. The majority of the history was taken through the husband who is present at bedside and access the translator. She wears a burka and the patient and husband will not allow a pelvic exam or for me to see or touch her.  The workup today reveals a negative ultrasound, negative urinalysis, and negative pregnancy test. I suspect that this is her first period back from being pregnant and is likely causing her more discomfort than normal. She is hemodynamically stable and does not appear to be actively bleeding. She will be discharged home with instructions to follow-up with her OB/GYN if not improving in the next few days.  I personally performed the services described in this documentation, which was scribed in my presence. The recorded information has been reviewed and is accurate.      Geoffery Lyons, MD 06/30/15 [phone]

## 2015-06-30 LAB — URINALYSIS, ROUTINE W REFLEX MICROSCOPIC
BILIRUBIN URINE: NEGATIVE
Glucose, UA: NEGATIVE mg/dL
KETONES UR: NEGATIVE mg/dL
Leukocytes, UA: NEGATIVE
NITRITE: NEGATIVE
Protein, ur: NEGATIVE mg/dL
Specific Gravity, Urine: 1.019 (ref 1.005–1.030)
UROBILINOGEN UA: 0.2 mg/dL (ref 0.0–1.0)
pH: 6.5 (ref 5.0–8.0)

## 2015-06-30 LAB — URINE MICROSCOPIC-ADD ON

## 2015-06-30 LAB — PREGNANCY, URINE: PREG TEST UR: NEGATIVE

## 2015-06-30 MED ORDER — TRAMADOL HCL 50 MG PO TABS: 50.0000 mg | ORAL_TABLET | Freq: Four times a day (QID) | ORAL | Status: DC | PRN

## 2015-06-30 NOTE — ED Notes (Signed)
MD at bedside. 

## 2015-06-30 NOTE — ED Notes (Signed)
Patient transported to Ultrasound 

## 2015-06-30 NOTE — Discharge Instructions (Signed)
Follow-up with your OB/GYN in the next few days, sooner if you experience worsening pain or bleeding.   Pelvic Pain Female pelvic pain can be caused by many different things and start from a variety of places. Pelvic pain refers to pain that is located in the lower half of the abdomen and between your hips. The pain may occur over a short period of time (acute) or may be reoccurring (chronic). The cause of pelvic pain may be related to disorders affecting the female reproductive organs (gynecologic), but it may also be related to the bladder, kidney stones, an intestinal complication, or muscle or skeletal problems. Getting help right away for pelvic pain is important, especially if there has been severe, sharp, or a sudden onset of unusual pain. It is also important to get help right away because some types of pelvic pain can be life threatening.  CAUSES  Below are only some of the causes of pelvic pain. The causes of pelvic pain can be in one of several categories.   Gynecologic.  Pelvic inflammatory disease.  Sexually transmitted infection.  Ovarian cyst or a twisted ovarian ligament (ovarian torsion).  Uterine lining that grows outside the uterus (endometriosis).  Fibroids, cysts, or tumors.  Ovulation.  Pregnancy.  Pregnancy that occurs outside the uterus (ectopic pregnancy).  Miscarriage.  Labor.  Abruption of the placenta or ruptured uterus.  Infection.  Uterine infection (endometritis).  Bladder infection.  Diverticulitis.  Miscarriage related to a uterine infection (septic abortion).  Bladder.  Inflammation of the bladder (cystitis).  Kidney stone(s).  Gastrointestinal.  Constipation.  Diverticulitis.  Neurologic.  Trauma.  Feeling pelvic pain because of mental or emotional causes (psychosomatic).  Cancers of the bowel or pelvis. EVALUATION  Your caregiver will want to take a careful history of your concerns. This includes recent changes in your  health, a careful gynecologic history of your periods (menses), and a sexual history. Obtaining your family history and medical history is also important. Your caregiver may suggest a pelvic exam. A pelvic exam will help identify the location and severity of the pain. It also helps in the evaluation of which organ system may be involved. In order to identify the cause of the pelvic pain and be properly treated, your caregiver may order tests. These tests may include:   A pregnancy test.  Pelvic ultrasonography.  An X-ray exam of the abdomen.  A urinalysis or evaluation of vaginal discharge.  Blood tests. HOME CARE INSTRUCTIONS   Only take over-the-counter or prescription medicines for pain, discomfort, or fever as directed by your caregiver.   Rest as directed by your caregiver.   Eat a balanced diet.   Drink enough fluids to make your urine clear or pale yellow, or as directed.   Avoid sexual intercourse if it causes pain.   Apply warm or cold compresses to the lower abdomen depending on which one helps the pain.   Avoid stressful situations.   Keep a journal of your pelvic pain. Write down when it started, where the pain is located, and if there are things that seem to be associated with the pain, such as food or your menstrual cycle.  Follow up with your caregiver as directed.  SEEK MEDICAL CARE IF:  Your medicine does not help your pain.  You have abnormal vaginal discharge. SEEK IMMEDIATE MEDICAL CARE IF:   You have heavy bleeding from the vagina.   Your pelvic pain increases.   You feel light-headed or faint.   You have  chills.   You have pain with urination or blood in your urine.   You have uncontrolled diarrhea or vomiting.   You have a fever or persistent symptoms for more than 3 days.  You have a fever and your symptoms suddenly get worse.   You are being physically or sexually abused.  MAKE SURE YOU:  Understand these  instructions.  Will watch your condition.  Will get help if you are not doing well or get worse. Document Released: 10/16/2004 Document Revised: 04/05/2014 Document Reviewed: 03/10/2012 [facility] Patient Information 2015 [place], [place]. This information is not intended to replace advice given to you by your health care provider. Make sure you discuss any questions you have with your health care provider.

## 2015-12-12 ENCOUNTER — Emergency Department (HOSPITAL_BASED_OUTPATIENT_CLINIC_OR_DEPARTMENT_OTHER)
Admission: EM | Admit: 2015-12-12 | Discharge: 2015-12-12 | Disposition: A | Discharge status: Home / Self Care | Payer: PPO | Attending: Emergency Medicine | Admitting: Emergency Medicine

## 2015-12-12 ENCOUNTER — Encounter (HOSPITAL_BASED_OUTPATIENT_CLINIC_OR_DEPARTMENT_OTHER): Payer: Self-pay

## 2015-12-12 DIAGNOSIS — Z3202 Encounter for pregnancy test, result negative: Secondary | ICD-10-CM | POA: Insufficient documentation

## 2015-12-12 DIAGNOSIS — Z79899 Other long term (current) drug therapy: Secondary | ICD-10-CM | POA: Insufficient documentation

## 2015-12-12 DIAGNOSIS — E0590 Thyrotoxicosis, unspecified without thyrotoxic crisis or storm: Secondary | ICD-10-CM | POA: Insufficient documentation

## 2015-12-12 DIAGNOSIS — M545 Low back pain: Secondary | ICD-10-CM | POA: Insufficient documentation

## 2015-12-12 LAB — URINALYSIS, ROUTINE W REFLEX MICROSCOPIC
Bilirubin Urine: NEGATIVE
Glucose, UA: NEGATIVE mg/dL
Hgb urine dipstick: NEGATIVE
Ketones, ur: NEGATIVE mg/dL
Leukocytes, UA: NEGATIVE
Nitrite: NEGATIVE
Protein, ur: NEGATIVE mg/dL
Specific Gravity, Urine: 1.014 (ref 1.005–1.030)
pH: 6.5 (ref 5.0–8.0)

## 2015-12-12 LAB — PREGNANCY, URINE: PREG TEST UR: NEGATIVE

## 2015-12-12 MED ORDER — NAPROXEN 500 MG PO TABS: 500.0000 mg | ORAL_TABLET | Freq: Two times a day (BID) | ORAL | Status: DC

## 2015-12-12 MED ORDER — DEXAMETHASONE SODIUM PHOSPHATE 10 MG/ML IJ SOLN
10.0000 mg | Freq: Once | INTRAMUSCULAR | Status: AC
  Administered 2015-12-12: 10 mg via INTRAMUSCULAR
  Filled 2015-12-12: qty 1

## 2015-12-12 MED ORDER — KETOROLAC TROMETHAMINE 60 MG/2ML IM SOLN
60.0000 mg | Freq: Once | INTRAMUSCULAR | Status: AC
  Administered 2015-12-12: 60 mg via INTRAMUSCULAR
  Filled 2015-12-12: qty 2

## 2015-12-12 NOTE — ED Notes (Signed)
Pt requesting female provider d/t cultural reasons

## 2015-12-12 NOTE — ED Notes (Signed)
Pt and family verbalize understanding of dc instructions and deny any further needs at this time 

## 2015-12-12 NOTE — ED Provider Notes (Signed)
CSN: [id number]     Arrival date & time 12/12/15  1838 History   First MD Initiated Contact with Patient 12/12/15 1932     Chief Complaint  Patient presents with  . Back Pain     (Consider location/radiation/quality/duration/timing/severity/associated sxs/prior Treatment) HPI   Melissa [name] is a 30 y.o. female with PMH significant for hyperthyroidism who presents with 5 day hx of , gradual onset, constant, mod-severe, low back pain that radiates to lower abdomen and down right leg that is made worse with movement or bending over and only minimally relieved with OTC medications and tramadol.  Denies fever, N/V/D, CP, SOB, abdominal pain, urinary symptoms, vaginal symptoms, bowel/bladder incontinence, numbness, tingling, or weakness.  No IVDU.  No hx of cancer.  She is a nonsmoker. No injury/trauma.   She has no concerns for STDs.   Past Medical History  Diagnosis Date  . Hyperthyroidism    Past Surgical History  Procedure Laterality Date  . Cesarean section    . Eye surgery      Lasik  . Cesarean section N/A 04/29/2015    Procedure: CESAREAN SECTION;  Surgeon: Noland FordyceKelly Fogleman, MD;  Location: [facility];  Service: Obstetrics;  Laterality: N/A;   No family history on file. Social History  Substance Use Topics  . Smoking status: Never Smoker   . Smokeless tobacco: Never Used  . Alcohol Use: No   OB History    Gravida Para Term Preterm AB TAB SAB Ectopic Multiple Living   2 2 2  0 0 0 0 0 0 1     Review of Systems All other systems negative unless otherwise stated in HPI    Allergies  Review of patient's allergies indicates no known allergies.  Home Medications   Prior to Admission medications   Medication Sig Start Date End Date Taking? Authorizing Provider  ibuprofen (ADVIL,MOTRIN) 800 MG tablet Take 1 tablet (800 mg total) by mouth every 8 (eight) hours. 05/03/15   Lawernce PittsMelanie N Bhambri, CNM  levothyroxine (SYNTHROID, LEVOTHROID) 88 MCG tablet Take 88 mcg by mouth daily before  breakfast.    Historical Provider, MD  naproxen (NAPROSYN) 500 MG tablet Take 1 tablet (500 mg total) by mouth 2 (two) times daily. 12/12/15   Nobel Brar, PA-C   BP 125/76 mmHg  Pulse 68  Temp(Src) 98.6 F (37 C) (Oral)  Resp 18  Wt 92.987 kg  SpO2 100%  LMP 10/26/2015 Physical Exam  Constitutional: She is oriented to person, place, and time. She appears well-developed and well-nourished.  HENT:  Head: Atraumatic.  Eyes: Conjunctivae are normal.  Cardiovascular: Normal rate, regular rhythm, normal heart sounds and intact distal pulses.   Pulses:      Posterior tibial pulses are 2+ on the right side, and 2+ on the left side.  Pulmonary/Chest: Effort normal and breath sounds normal.  Abdominal: Soft. Bowel sounds are normal. She exhibits no distension. There is no tenderness.  Musculoskeletal: Normal range of motion. She exhibits tenderness.       Lumbar back: She exhibits tenderness and pain. She exhibits normal range of motion, no bony tenderness, no swelling, no edema, no deformity, no laceration, no spasm and normal pulse.       Back:  No spinous process tenderness.  No step offs.  Neurological: She is alert and oriented to person, place, and time.  No saddle anesthesia. Strength and sensation intact bilaterally throughout lower extremities.  Skin: Skin is warm and dry.  Psychiatric: She has a normal mood  and affect. Her behavior is normal.    ED Course  Procedures (including critical care time) Labs Review Labs Reviewed  URINALYSIS, ROUTINE W REFLEX MICROSCOPIC (NOT AT [facility])  PREGNANCY, URINE    Imaging Review No results found. I have personally reviewed and evaluated these images and lab results as part of my medical decision-making.   EKG Interpretation None      MDM   Final diagnoses:  Right low back pain, with sciatica presence unspecified    Patient presents with lower back pain x 5 days.  No red flags.  No urinary or vaginal symptoms.  No fever, abdominal  pain, N/V/D.  VSS, NAD.  On exam, heart RRR, lungs CTAB, abdomen soft and benign.  TTP along right lower back.  No focal neurological deficits.  Suspect musculosketal or sciatica.  Doubt UTI given negative UA.  Doubt pregnancy given negative urine preg.  Will give toradol and decadron.  D/c home with naprosyn.  Evaluation does not show pathology requiring ongoing emergent intervention or admission. Pt is hemodynamically stable and mentating appropriately. Discussed findings/results and plan with patient/guardian, who agrees with plan. All questions answered. Return precautions discussed and outpatient follow up given. Resource guide given to establish primary care.      Cheri Fowler, PA-C 12/12/15 2015  Geoffery Lyons, MD 12/12/15 2139

## 2015-12-12 NOTE — ED Notes (Signed)
Lower back pain that radiates to lower abd pain- started friday-no injury-pian worse with movement and when bends-NAD-husband interpreter

## 2015-12-12 NOTE — ED Notes (Signed)
Pt c/o low back pain radiating into low abd. Denies urinary s/s. No n/v/d. No vaginal d/c. Radiates down right leg at times. Has tried OTC meds and Tramadol X 1 without relief.

## 2015-12-12 NOTE — Discharge Instructions (Signed)
Back Pain, Adult °Back pain is very common in adults. The cause of back pain is rarely dangerous and the pain often gets better over time. The cause of your back pain may not be known. Some common causes of back pain include: °· Strain of the muscles or ligaments supporting the spine. °· Wear and tear (degeneration) of the spinal disks. °· Arthritis. °· Direct injury to the back. °For many people, back pain may return. Since back pain is rarely dangerous, most people can learn to manage this condition on their own. °HOME CARE INSTRUCTIONS °Watch your back pain for any changes. The following actions may help to lessen any discomfort you are feeling: °· Remain active. It is stressful on your back to sit or stand in one place for long periods of time. Do not sit, drive, or stand in one place for more than 30 minutes at a time. Take short walks on even surfaces as soon as you are able. Try to increase the length of time you walk each day. °· Exercise regularly as directed by your health care provider. Exercise helps your back heal faster. It also helps avoid future injury by keeping your muscles strong and flexible. °· Do not stay in bed. Resting more than 1-2 days can delay your recovery. °· Pay attention to your body when you bend and lift. The most comfortable positions are those that put less stress on your recovering back. Always use proper lifting techniques, including: °¨ Bending your knees. °¨ Keeping the load close to your body. °¨ Avoiding twisting. °· Find a comfortable position to sleep. Use a firm mattress and lie on your side with your knees slightly bent. If you lie on your back, put a pillow under your knees. °· Avoid feeling anxious or stressed. Stress increases muscle tension and can worsen back pain. It is important to recognize when you are anxious or stressed and learn ways to manage it, such as with exercise. °· Take medicines only as directed by your health care provider. Over-the-counter  medicines to reduce pain and inflammation are often the most helpful. Your health care provider may prescribe muscle relaxant drugs. These medicines help dull your pain so you can more quickly return to your normal activities and healthy exercise. °· Apply ice to the injured area: °¨ Put ice in a plastic bag. °¨ Place a towel between your skin and the bag. °¨ Leave the ice on for 20 minutes, 2-3 times a day for the first 2-3 days. After that, ice and heat may be alternated to reduce pain and spasms. °· Maintain a healthy weight. Excess weight puts extra stress on your back and makes it difficult to maintain good posture. °SEEK MEDICAL CARE IF: °· You have pain that is not relieved with rest or medicine. °· You have increasing pain going down into the legs or buttocks. °· You have pain that does not improve in one week. °· You have night pain. °· You lose weight. °· You have a fever or chills. °SEEK IMMEDIATE MEDICAL CARE IF:  °· You develop new bowel or bladder control problems. °· You have unusual weakness or numbness in your arms or legs. °· You develop nausea or vomiting. °· You develop abdominal pain. °· You feel faint. °  °This information is not intended to replace advice given to you by your health care provider. Make sure you discuss any questions you have with your health care provider. °  °Document Released: 11/19/2005 Document Revised: 12/10/2014 Document Reviewed: 03/23/2014 °Elsevier Interactive Patient Education ©2016 Elsevier   Inc. ° °Emergency Department Resource Guide °1) Find a Doctor and Pay Out of Pocket °Although you won't have to find out who is covered by your insurance plan, it is a good idea to ask around and get recommendations. You will then need to call the office and see if the doctor you have chosen will accept you as a new patient and what types of options they offer for patients who are self-pay. Some doctors offer discounts or will set up payment plans for their patients who do not  have insurance, but you will need to ask so you aren't surprised when you get to your appointment. ° °2) Contact Your Local Health Department °Not all health departments have doctors that can see patients for sick visits, but many do, so it is worth a call to see if yours does. If you don't know where your local health department is, you can check in your phone book. The CDC also has a tool to help you locate your state's health department, and many state websites also have listings of all of their local health departments. ° °3) Find a Walk-in Clinic °If your illness is not likely to be very severe or complicated, you may want to try a walk in clinic. These are popping up all over the country in pharmacies, drugstores, and shopping centers. They're usually staffed by nurse practitioners or physician assistants that have been trained to treat common illnesses and complaints. They're usually fairly quick and inexpensive. However, if you have serious medical issues or chronic medical problems, these are probably not your best option. ° °No Primary Care Doctor: °- Call [facility] at  832-8000 - they can help you locate a primary care doctor that  accepts your insurance, provides certain services, etc. °- Physician Referral Service- 1-800-533-3463 ° °Chronic Pain Problems: °Organization         Address  Phone   Notes  °[facility]  (336) 297-2271 Patients need to be referred by their primary care doctor.  ° °Medication Assistance: °Organization         Address  Phone   Notes  °[place] [facility] [address] °[place], [place] [address] (336) 641-8030 --Must be a resident of [place] °-- Must have NO insurance coverage whatsoever (no Medicaid/ Medicare, etc.) °-- The pt. MUST have a primary care doctor that directs their care regularly and follows them in the community °  °[facility]  (866) 331-1348   °[employer]  (888) 892-1162   ° °Agencies that  provide inexpensive medical care: °Organization         Address  Phone   Notes  °[facility]  (336) 832-8035   °[facility]    (336) 832-7272   °[facility] [address] °[place], [place] [address] (336) 832-4777   °[facility] [address], °[place] (336) 271-4999   °[facility]    (336) 373-0678   °[facility]    (336) 272-1050   °[facility] ° [address], [place] Phone:  (336) 832-4444, Fax:  (336) 832-4440 Hours of Operation:  9 am - 6 pm, M-F.  Also accepts Medicaid/Medicare and self-pay.  °[facility] ° [address], [place] Phone: (336) 832-3150, Fax: (336) [phone]. Hours of Operation:  8:30 am - 5:30 pm, M-F.  Also accepts Medicaid and self-pay.  °[facility]   [place] [address], [place] Phone: (336) 878-6027   °[facility] [address], [place], [place] (336)723-1848, Ext. 123 Mondays & Thursdays: 7-9 AM.  First 15 patients are seen on a first come, first serve basis. °  ° °Medicaid-accepting [place] Providers: ° °Organization         Address  Phone   Notes  °[facility] [address], [place] (336) 641-2100 Also accepts self-pay patients.  °[facility] [address], [place] ° (336) 856-9996   °[facility] [address], [place] (336) 288-8857   °[facility] [address] [place] [address], [place] (336) 299-7000   °Veita Bland [address], [place]  ° (336) 373-1557 Only accepts [place] Access Medicaid patients after they have their name applied to their card.  ° °Self-Pay (no insurance) in [place]: ° °Organization         Address  Phone   Notes  °Sickle Cell Patients, [facility] [address], [place] (336) 832-1970   °[facility] [address], [place] (336) 832-4400   °[facility] ° [address], [place] (336) 992-4800   °[facility]/Dr. Osei-Bonsu ° [address] [place] [address], [place] or [address], [place] (336) 841-8500 Phone number for both [place] and [place] locations is the same.  °[facility] [address], [place] (336) 299-0000   °[facility] [address] [place] [address], [place] or [address] (336) 852-7530 °(336) 878-2260   °[facility] [address], [place] (336) 350-1642, phone; (336) 294-5005, fax Sees patients 1st and 3rd Saturday of every month.  Must not qualify for public or private insurance (i.e. Medicaid, Medicare, [place] Health Choice, Veterans' Benefits) • Household income should be no more than 200% of the poverty level •The clinic cannot treat you if you are pregnant or think you are pregnant • Sexually transmitted diseases are not treated at the clinic.  ° ° °Dental Care: °Organization         Address  Phone  Notes  °[facility] [facility] [address], [place] (336) 641-6152 Accepts children up to age 21 who are enrolled in Medicaid or [place] Health Choice; pregnant women with a Medicaid card; and children who have applied for Medicaid or [place] Health Choice, but were declined, whose parents can pay a reduced fee at time of service.  °[facility] [place]  [address], [place] (336) 641-7733 Accepts children up to age 21 who are enrolled in Medicaid or [place] Health Choice; pregnant women with a Medicaid card; and children who have applied for Medicaid or [place] Health Choice, but were declined, whose parents can pay a reduced fee at time of service.  °[facility] ° [address],  (336) 641-4533 Patients are seen by appointment only. Walk-ins  are not accepted. [facility] will see patients 18 years of age and older. °Monday - Tuesday (8am-5pm) °Most Wednesdays (8:30-5pm) °$30 per visit, cash only  °[facility] ° [address], [place] (336) 641-4533 Patients are seen by appointment only. Walk-ins are not accepted. [facility] will see patients 18 years of age   and older. °One Wednesday Evening (Monthly: Volunteer Based).  $30 per visit, cash only  °[facility]  (919) 537-3737 for adults; Children under age 4, call Graduate Pediatric Dentistry at (919) [phone]. Children aged 4-14, please call (919) 537-3737 to request a pediatric application. ° Dental services are provided in all areas of dental care including fillings, crowns and bridges, complete and partial dentures, implants, gum treatment, root canals, and extractions. Preventive care is also provided. Treatment is provided to both adults and children. °Patients are selected via a lottery and there is often a waiting list. °  °[facility] [address], °[place] ° (336) 763-8833 [email or web address] °  °[facility] [address], [place], [place] (336)723-1848, Ext. 123 Second and Fourth Thursday of each month, opens at 6:30 AM; Clinic ends at 9 AM.  Patients are seen on a first-come first-served basis, and a limited number are seen during each clinic.  ° °[facility] ° [address], [place], [place] (336) 723-7904   Eligibility Requirements °You must have lived in [place], [place], or [place] counties for at least the last three months. °  You cannot be eligible for state or federal sponsored healthcare insurance, including Veterans Administration, Medicaid, or Medicare. °  You generally cannot be eligible for healthcare insurance through your employer.  °  How to apply: °Eligibility screenings are held every Tuesday and Wednesday afternoon from 1:00 pm until 4:00 pm. You do not need an appointment  for the interview!  °[facility] [address], [place], [place] 336-631-2330   °[facility]  336-342-8273   °[facility]  336-703-3100   °[facility]  336-570-6415   ° °Behavioral Health Resources in the Community: °Intensive Outpatient Programs °Organization         Address  Phone  Notes  °[facility] [address], [place], [place] 336-878-6098   °[facility] [address], [place], [place] 336-832-9800   °[facility] [address], [place], [place] ° 336-882-2125   °[facility] [address],  °[place], [place] 1-800-853-5163 or 336-641-4981   °Substance Abuse Resources °Organization         Address  Phone  Notes  °[facility]  336-882-2125   °[facility]  336-784-9470   °[facility]  336-285-9073   °[facility]  336-845-3988   °Residential & Outpatient Substance Abuse Program  1-800-659-3381   °Psychological Services °Organization         Address  Phone  Notes  °[facility]  336- 832-9600   °[facility]  336- 378-7881   °[facility] [address], [place] 1-800-853-5163 or 336-641-4981   ° °Mobile Crisis Teams °Organization         Address  Phone  Notes  °[facility], Mobile Crisis Care Unit  1-877-626-1772   °Assertive °[facility] ° [address] [place], [place] 336-834-9664   °Sharon DeEsch [address] °[place] 336-554-5454   ° °Self-Help/Support Groups °Organization         Address  Phone             Notes  °[facility] - variety of support groups  336- 373-1402 Call for more information  °Narcotics Anonymous (NA), [facility] [address], °[place]  2 meetings at   this location  ° °Residential Treatment Programs °Organization         Address  Phone  Notes  °[facility] [address],    °[place]  1-866-801-8205   °[facility] ° [address], [place], [place] 704-293-8524   °[facility] [address], [place] 336-845-3988 Admissions: 8am-3pm M-F  °[facility] [address]    °[place], [place] 336-841-1104   °[facility] [address], [place], [place] 336-379-7146   °[facility] [address]  °[place], [place] 336-285-9073   °[facility] - Intensive Outpatient [address], [place], [place] 336-852-3033   °[facility] [address]  °[place], [place] 1-877-615-2722 or 336-784-9470   °[facility] [address] [place], [place] 336-227-7417 Accepts Medicaid  °[facility] [address]  °[place] 1-800-659-3381 Substance Abuse/Addiction Treatment  ° °[place] Behavioral Health Resources °Organization         Address  Phone  Notes  °[facility]  (888) 581-9988   °Julie Brannon, PhD [address] [place], [place]   (336) 349-5553 or (336) 951-0000   °[facility]   [address] °[place], [place] (336) 349-4454   °[facility] [address], [place], [place] (336) 342-8316 Insurance/Medicaid/sponsorship through [facility]  °[facility] [address]                                    [place], [place] (336) 342-8316 Therapy/tele-psych/case  °[facility] [address]  ° [place] (336) 349-2233    °Dr. Arfeen  (336) 349-4544   °[facility] [place]  [employer] [place] [facility] 1) [address], [place] °2) [address], [place] °3)  [address], [place] (336) 349-3220 °(336) 342-7768 ° °(336) 342-8140   °[place] [facility] (336) 342-1394 or (336) 342-3537 (After Hours)    ° ° ° °

## 2015-12-22 LAB — TSH: TSH: 3.28

## 2015-12-22 LAB — T4, FREE: T4, Free Direct Dialysis: 1.29

## 2015-12-23 LAB — T3, FREE: T3 FREE: 2.8

## 2015-12-26 ENCOUNTER — Telehealth: Payer: Self-pay | Admitting: *Deleted

## 2015-12-26 NOTE — Telephone Encounter (Signed)
Unable to reach patient at time of pre-visit call. Left message for patient to return call when available.  

## 2015-12-27 ENCOUNTER — Encounter: Payer: Self-pay | Admitting: Family

## 2015-12-27 ENCOUNTER — Telehealth: Payer: Self-pay | Admitting: Family

## 2015-12-27 ENCOUNTER — Other Ambulatory Visit: Payer: Self-pay | Admitting: Family

## 2015-12-27 ENCOUNTER — Ambulatory Visit (INDEPENDENT_AMBULATORY_CARE_PROVIDER_SITE_OTHER): Payer: PPO | Admitting: Family

## 2015-12-27 VITALS — HR 67 | Temp 98.7°F | Resp 16 | Ht 64.0 in | Wt 211.2 lb

## 2015-12-27 DIAGNOSIS — E039 Hypothyroidism, unspecified: Secondary | ICD-10-CM | POA: Diagnosis not present

## 2015-12-27 DIAGNOSIS — L659 Nonscarring hair loss, unspecified: Secondary | ICD-10-CM | POA: Diagnosis not present

## 2015-12-27 DIAGNOSIS — Z0001 Encounter for general adult medical examination with abnormal findings: Secondary | ICD-10-CM

## 2015-12-27 DIAGNOSIS — Z8489 Family history of other specified conditions: Secondary | ICD-10-CM | POA: Diagnosis not present

## 2015-12-27 DIAGNOSIS — Z0000 Encounter for general adult medical examination without abnormal findings: Secondary | ICD-10-CM | POA: Insufficient documentation

## 2015-12-27 DIAGNOSIS — M5441 Lumbago with sciatica, right side: Secondary | ICD-10-CM | POA: Diagnosis not present

## 2015-12-27 DIAGNOSIS — R5383 Other fatigue: Secondary | ICD-10-CM | POA: Diagnosis not present

## 2015-12-27 DIAGNOSIS — E559 Vitamin D deficiency, unspecified: Secondary | ICD-10-CM | POA: Insufficient documentation

## 2015-12-27 DIAGNOSIS — E538 Deficiency of other specified B group vitamins: Secondary | ICD-10-CM | POA: Diagnosis not present

## 2015-12-27 LAB — BASIC METABOLIC PANEL
BUN: 18 mg/dL (ref 6–23)
CALCIUM: 9.3 mg/dL (ref 8.4–10.5)
CHLORIDE: 105 meq/L (ref 96–112)
CO2: 25 meq/L (ref 19–32)
CREATININE: 0.69 mg/dL (ref 0.40–1.20)
GFR: 106.43 mL/min (ref >60.00)
GLUCOSE: 89 mg/dL (ref 70–99)
Potassium: 3.9 mEq/L (ref 3.5–5.1)
Sodium: 139 mEq/L (ref 135–145)

## 2015-12-27 LAB — LIPID PANEL
CHOL/HDL RATIO: 5
Cholesterol: 175 mg/dL (ref 0–200)
HDL: 35.5 mg/dL — ABNORMAL LOW (ref >39.00)
LDL CALC: 116 mg/dL — AB (ref 0–99)
NONHDL: 139.85
TRIGLYCERIDES: 120 mg/dL (ref 0.0–149.0)
VLDL: 24 mg/dL (ref 0.0–40.0)

## 2015-12-27 LAB — HEPATIC FUNCTION PANEL
ALT: 16 U/L (ref 0–35)
AST: 17 U/L (ref 0–37)
Albumin: 4.2 g/dL (ref 3.5–5.2)
Alkaline Phosphatase: 64 U/L (ref 39–117)
BILIRUBIN DIRECT: 0 mg/dL (ref 0.0–0.3)
BILIRUBIN TOTAL: 0.4 mg/dL (ref 0.2–1.2)
Total Protein: 7.3 g/dL (ref 6.0–8.3)

## 2015-12-27 LAB — CBC WITH DIFFERENTIAL/PLATELET
BASOS PCT: 0.3 % (ref 0.0–3.0)
Basophils Absolute: 0 10*3/uL (ref 0.0–0.1)
EOS ABS: 0.1 10*3/uL (ref 0.0–0.7)
Eosinophils Relative: 1.4 % (ref 0.0–5.0)
HCT: 39.6 % (ref 36.0–46.0)
Hemoglobin: 13.2 g/dL (ref 12.0–15.0)
LYMPHS ABS: 2.2 10*3/uL (ref 0.7–4.0)
Lymphocytes Relative: 28.7 % (ref 12.0–46.0)
MCHC: 33.5 g/dL (ref 30.0–36.0)
MCV: 81.3 fl (ref 78.0–100.0)
MONO ABS: 0.4 10*3/uL (ref 0.1–1.0)
Monocytes Relative: 5.7 % (ref 3.0–12.0)
NEUTROS ABS: 4.9 10*3/uL (ref 1.4–7.7)
Neutrophils Relative %: 63.9 % (ref 43.0–77.0)
PLATELETS: 230 10*3/uL (ref 150.0–400.0)
RBC: 4.87 Mil/uL (ref 3.87–5.11)
RDW: 14.5 % (ref 11.5–15.5)
WBC: 7.7 10*3/uL (ref 4.0–10.5)

## 2015-12-27 LAB — URINALYSIS, ROUTINE W REFLEX MICROSCOPIC
BILIRUBIN URINE: NEGATIVE
HGB URINE DIPSTICK: NEGATIVE
KETONES UR: NEGATIVE
LEUKOCYTES UA: NEGATIVE
Nitrite: NEGATIVE
RBC / HPF: NONE SEEN (ref 0–?)
Specific Gravity, Urine: 1.025 (ref 1.000–1.030)
TOTAL PROTEIN, URINE-UPE24: NEGATIVE
UROBILINOGEN UA: 0.2 (ref 0.0–1.0)
Urine Glucose: NEGATIVE
WBC UA: NONE SEEN (ref 0–?)
pH: 6 (ref 5.0–8.0)

## 2015-12-27 LAB — VITAMIN D 25 HYDROXY (VIT D DEFICIENCY, FRACTURES): VITD: 14.88 ng/mL — ABNORMAL LOW (ref 30.00–100.00)

## 2015-12-27 LAB — VITAMIN B12: VITAMIN B 12: 301 pg/mL (ref 211–911)

## 2015-12-27 MED ORDER — CYCLOBENZAPRINE HCL 5 MG PO TABS: 5.0000 mg | ORAL_TABLET | Freq: Every evening | ORAL | Status: DC | PRN

## 2015-12-27 MED ORDER — METHYLPREDNISOLONE 4 MG PO TBPK
ORAL_TABLET | ORAL | Status: DC
Start: 2015-12-27 — End: 2016-08-13

## 2015-12-27 NOTE — Patient Instructions (Signed)
Please complete lab work prior to leaving. For back pain- please begin medrol dose pak (steroid medication to help with the inflammation in your back) You may use flexeril at bedtime as needed for back spasm/tightness. Call if back pain worsens or if back pain does not improve.  When your back is feeling better you can resume your daily walking routine.  Welcome to [facility]!

## 2015-12-27 NOTE — Telephone Encounter (Signed)
Records received, abstracted and forwarded to PCP for review.

## 2015-12-27 NOTE — Telephone Encounter (Signed)
Could you please contact Dr. Elpidio Eric office and request copy of Pap and most recent labs including TSH?

## 2015-12-27 NOTE — Telephone Encounter (Signed)
Called [phone] and spoke with medical records and requested below records. She will fax to nurse station. Awaiting results.

## 2015-12-27 NOTE — Progress Notes (Signed)
Subjective:    Patient ID: Melissa [name], female    DOB: Jan 03, 1986, 30 y.o.   MRN: [id number]  HPI  Melissa [name] is a 30 yr old female who presents today to establish care. Her primary language is Arabic.  Her husband assists in translation. Has followed with GYN until now.    Patient has hx of hypothyroid- she began treatment in 2013.  She reports + compliance with synthroid. Patient reports + fatigue.  Reports hx of b12 injections.  Dr. Ernestina Penna- has been managing TSH.    Patient presents today for complete physical.  Immunizations: declines flu shot. Unsure of last tetanus.  Diet: reports healthy diet Exercise:  Was walking on treadmill Pap Smear: approximately 1 year ago- normal per husband Dental: Due  Vision- 1 year ago.   Back pain-  Pain is located in the lower back.  Pain is R>L.  Worse with bending.  No improvement with motrin.  Pain radiates into the right thigh. Pain began 2 weeks ago.    Review of Systems  Constitutional: Negative for unexpected weight change.  HENT: Negative for hearing loss and rhinorrhea.   Eyes: Negative for visual disturbance.  Respiratory: Negative for cough.   Cardiovascular: Negative for leg swelling.  Gastrointestinal: Negative for diarrhea and constipation.  Genitourinary: Negative for dysuria, frequency and menstrual problem.  Musculoskeletal: Positive for back pain.  Skin: Negative for rash.  Neurological:       Occasional headaches  Hematological: Negative for adenopathy.  Psychiatric/Behavioral:       Denies current depression/anxiety       Past Medical History  Diagnosis Date  . Hyperthyroidism     Social History   Social History  . Marital Status: Married    Spouse Name: N/A  . Number of Children: N/A  . Years of Education: N/A   Occupational History  . Not on file.   Social History Main Topics  . Smoking status: Never Smoker   . Smokeless tobacco: Never Used  . Alcohol Use: No  . Drug Use: No  . Sexual  Activity: Not on file   Other Topics Concern  . Not on file   Social History Narrative    Past Surgical History  Procedure Laterality Date  . Eye surgery      Lasik  . Cesarean section    . Cesarean section N/A 04/29/2015    Procedure: CESAREAN SECTION;  Surgeon: Noland Fordyce, MD;  Location: [facility];  Service: Obstetrics;  Laterality: N/A;    Family History  Problem Relation Age of Onset  . Diabetes Mother   . Thyroid disease Mother   . Diabetes Father   . Hypertension Father   . Hyperlipidemia Father     No Known Allergies  Current Outpatient Prescriptions on File Prior to Visit  Medication Sig Dispense Refill  . levothyroxine (SYNTHROID, LEVOTHROID) 88 MCG tablet Take 88 mcg by mouth daily before breakfast.     No current facility-administered medications on file prior to visit.    Pulse 67  Temp(Src) 98.7 F (37.1 C) (Oral)  Resp 16  Ht  (1.626 m)  Wt 211 lb 3.2 oz (95.8 kg)  BMI 36.23 kg/m2  SpO2 100%  LMP 10/26/2015    Objective:   Physical Exam  Constitutional: She is oriented to person, place, and time. She appears well-developed and well-nourished.  HENT:  Head: Normocephalic and atraumatic.  Cardiovascular: Normal rate, regular rhythm and normal heart sounds.   No murmur heard. Pulmonary/Chest:  Effort normal and breath sounds normal. No respiratory distress. She has no wheezes.  Abdominal: Soft. Bowel sounds are normal. She exhibits no distension and no mass. There is no tenderness. There is no rebound and no guarding.  Genitourinary:  Breast/pelvic deferred  Musculoskeletal: She exhibits no edema.  Neurological: She is alert and oriented to person, place, and time.  Reflex Scores:      Patellar reflexes are 2+ on the right side and 2+ on the left side. Bilateral LE strength is 5/5  Skin: Skin is warm and dry.  Slight thinning of hair at temples noted  Psychiatric: She has a normal mood and affect. Her behavior is normal. Judgment and  thought content normal.  Breast/pelvic:deferred to GYN        Assessment & Plan:  I did offer to arrange an arabic interpretor next visit. Husband and patient decline interpretor.   Hx b12 def- obtain cbc, b12 level.   Low back pain with sciatica- rx with medrol dose pak and prn flexeril. Pt is not nursing.    Hair loss/fatigue- will request tsh from GYN. Will also obtain cbc to assess for anemia.

## 2015-12-27 NOTE — Telephone Encounter (Signed)
b12 is low normal.  Vitamin D level is low.  Advise patient to begin otc vit b12 100 once daily and rx vit D 50000 units once weekly for 12 weeks, then repeat vit D level (dx Vit D deficiency).    No anemia.  HDL "good cholesterol" should be higher, exercise can help to raise good cholesterol. Other labs look good.

## 2015-12-27 NOTE — Progress Notes (Signed)
Pre visit review using our clinic review tool, if applicable. No additional management support is needed unless otherwise documented below in the visit note. 

## 2015-12-27 NOTE — Telephone Encounter (Signed)
Left message for pt to return my call.

## 2015-12-27 NOTE — Assessment & Plan Note (Signed)
Obtain tsh from GYN's office- drawn last week per husband.  Continue synthroid.

## 2015-12-27 NOTE — Assessment & Plan Note (Signed)
Discussed healthy diet, exercise, weight loss.  Tdap today, declines flu shot

## 2015-12-28 MED ORDER — VITAMIN B-12 100 MCG PO TABS: 100.0000 ug | ORAL_TABLET | Freq: Every day | ORAL | Status: DC

## 2015-12-28 MED ORDER — VITAMIN D (ERGOCALCIFEROL) 1.25 MG (50000 UNIT) PO CAPS: 50000.0000 [IU] | ORAL_CAPSULE | ORAL | Status: DC

## 2015-12-28 NOTE — Telephone Encounter (Signed)
Notified pt's spouse and he voices understanding. Lab appt scheduled for 03/28/16 at 9am.  Future order entered.

## 2016-02-22 ENCOUNTER — Telehealth: Payer: Self-pay | Admitting: Family

## 2016-02-22 NOTE — Telephone Encounter (Signed)
100 mcg PO daily

## 2016-02-22 NOTE — Telephone Encounter (Signed)
Caller name: Saud Relationship to patient: Husband Can be reached: [phone]   Reason for call: Husband would like to know what dosage of the patient's B12

## 2016-02-22 NOTE — Telephone Encounter (Signed)
Left detailed message on spouse's voicemail.

## 2016-03-28 ENCOUNTER — Other Ambulatory Visit: Payer: PPO

## 2016-04-13 ENCOUNTER — Other Ambulatory Visit: Payer: Self-pay | Admitting: Family

## 2016-04-13 NOTE — Telephone Encounter (Signed)
Send to me - not sure if she needs to continue to take this.  Will let Melissa address on her return

## 2016-04-16 NOTE — Telephone Encounter (Signed)
Left detailed message on spouse voicemail to call and schedule lab appt. Future vitamin D level already in EPIC.

## 2016-04-16 NOTE — Telephone Encounter (Signed)
Please ask pt to return to complete vit D level. If still low will refill, otherwise with begin otc supplement.

## 2016-04-18 ENCOUNTER — Ambulatory Visit (INDEPENDENT_AMBULATORY_CARE_PROVIDER_SITE_OTHER): Payer: PPO | Admitting: Family

## 2016-04-18 ENCOUNTER — Telehealth: Payer: Self-pay | Admitting: Family

## 2016-04-18 DIAGNOSIS — E559 Vitamin D deficiency, unspecified: Secondary | ICD-10-CM | POA: Diagnosis not present

## 2016-04-18 LAB — VITAMIN D 25 HYDROXY (VIT D DEFICIENCY, FRACTURES): VITD: 31.47 ng/mL (ref 30.00–100.00)

## 2016-04-18 NOTE — Telephone Encounter (Signed)
Pt was no show today, has not rescheduled, 1st no show, charge or no charge?

## 2016-04-19 ENCOUNTER — Telehealth: Payer: Self-pay | Admitting: Family

## 2016-04-19 MED ORDER — VITAMIN D3 25 MCG (1000 UT) PO CAPS: 1.0000 | ORAL_CAPSULE | Freq: Every day | ORAL | Status: DC

## 2016-04-19 NOTE — Telephone Encounter (Signed)
I believe she came in late and was asked to reschedule.  No charge.

## 2016-04-19 NOTE — Progress Notes (Signed)
Pt not seen today

## 2016-04-19 NOTE — Telephone Encounter (Signed)
Please let pt know that her vit d looks good. Stop weekly supplements. Start vit D 1000units once daily otc.

## 2016-04-20 NOTE — Telephone Encounter (Signed)
See 04/19/16 phone note. Pt to change to otc vitamin D.

## 2016-04-20 NOTE — Telephone Encounter (Signed)
Left detailed message on home# and to call if any questions. 

## 2016-08-13 ENCOUNTER — Ambulatory Visit (INDEPENDENT_AMBULATORY_CARE_PROVIDER_SITE_OTHER): Payer: PPO | Admitting: Family

## 2016-08-13 ENCOUNTER — Ambulatory Visit: Payer: PPO | Admitting: Family

## 2016-08-13 ENCOUNTER — Encounter: Payer: Self-pay | Admitting: Family

## 2016-08-13 VITALS — HR 55 | Temp 98.5°F | Resp 16 | Ht 64.0 in | Wt 213.4 lb

## 2016-08-13 DIAGNOSIS — E039 Hypothyroidism, unspecified: Secondary | ICD-10-CM | POA: Diagnosis not present

## 2016-08-13 DIAGNOSIS — M2550 Pain in unspecified joint: Secondary | ICD-10-CM | POA: Diagnosis not present

## 2016-08-13 DIAGNOSIS — E559 Vitamin D deficiency, unspecified: Secondary | ICD-10-CM | POA: Diagnosis not present

## 2016-08-13 DIAGNOSIS — E538 Deficiency of other specified B group vitamins: Secondary | ICD-10-CM | POA: Diagnosis not present

## 2016-08-13 DIAGNOSIS — L680 Hirsutism: Secondary | ICD-10-CM

## 2016-08-13 DIAGNOSIS — R5383 Other fatigue: Secondary | ICD-10-CM | POA: Diagnosis not present

## 2016-08-13 LAB — CBC WITH DIFFERENTIAL/PLATELET
BASOS PCT: 0.3 % (ref 0.0–3.0)
Basophils Absolute: 0 10*3/uL (ref 0.0–0.1)
EOS PCT: 1.6 % (ref 0.0–5.0)
Eosinophils Absolute: 0.1 10*3/uL (ref 0.0–0.7)
HEMATOCRIT: 39.7 % (ref 36.0–46.0)
HEMOGLOBIN: 13.6 g/dL (ref 12.0–15.0)
LYMPHS PCT: 37.6 % (ref 12.0–46.0)
Lymphs Abs: 2.4 10*3/uL (ref 0.7–4.0)
MCHC: 34.3 g/dL (ref 30.0–36.0)
MCV: 79.4 fl (ref 78.0–100.0)
Monocytes Absolute: 0.5 10*3/uL (ref 0.1–1.0)
Monocytes Relative: 7.6 % (ref 3.0–12.0)
Neutro Abs: 3.4 10*3/uL (ref 1.4–7.7)
Neutrophils Relative %: 52.9 % (ref 43.0–77.0)
Platelets: 240 10*3/uL (ref 150.0–400.0)
RBC: 5.01 Mil/uL (ref 3.87–5.11)
RDW: 14.4 % (ref 11.5–15.5)
WBC: 6.5 10*3/uL (ref 4.0–10.5)

## 2016-08-13 LAB — VITAMIN D 25 HYDROXY (VIT D DEFICIENCY, FRACTURES): VITD: 17.81 ng/mL — ABNORMAL LOW (ref 30.00–100.00)

## 2016-08-13 LAB — TSH: TSH: 5.61 u[IU]/mL — AB (ref 0.35–4.50)

## 2016-08-13 LAB — VITAMIN B12: VITAMIN B 12: 294 pg/mL (ref 211–911)

## 2016-08-13 LAB — SEDIMENTATION RATE: Sed Rate: 14 mm/hr (ref 0–20)

## 2016-08-13 MED ORDER — BENZOYL PEROXIDE-ERYTHROMYCIN 5-3 % EX GEL: Freq: Two times a day (BID) | CUTANEOUS | 0 refills | Status: DC

## 2016-08-13 NOTE — Progress Notes (Signed)
Subjective:    Patient ID: Melissa [name], female    DOB: May 05, 1986, 30 y.o.   MRN: [id number]  [place] presents today for follow up.  1) Hypothyroid- Reports good compliance with synthroid.  Feeling well o nthis dose.   Lab Results  Component Value Date   TSH 3.28 12/22/2015   2) Vit D deficiency- Not currently taking vit D.    3) Arthralgia- reports pain/swelling in both hands/legs/feet.  Swelling only occurs after travel.  Denies pain in her hands/feet today, but this has been intermittent x 2-3 day months. Denies fevers.    Not currently taking b12.    Review of Systems See HPI  Past Medical History:  Diagnosis Date  . Hyperthyroidism      Social History   Social History  . Marital status: Married    Spouse name: N/A  . Number of children: N/A  . Years of education: N/A   Occupational History  . Not on file.   Social History Main Topics  . Smoking status: Never Smoker  . Smokeless tobacco: Never Used  . Alcohol use No  . Drug use: No  . Sexual activity: Not on file   Other Topics Concern  . 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 CESAREAN SECTION    . CESAREAN SECTION N/A 04/29/2015   Procedure: CESAREAN SECTION;  Surgeon: Aloha Gell, MD;  Location: [facility];  Service: Obstetrics;  Laterality: N/A;  . EYE SURGERY     Lasik    Family History  Problem Relation Age of Onset  . Diabetes Mother   . Thyroid disease Mother   . Diabetes Father   . Hypertension Father   . Hyperlipidemia Father     No Known Allergies  Current Outpatient Prescriptions on File Prior to Visit  Medication Sig Dispense Refill  . levothyroxine (SYNTHROID, LEVOTHROID) 88 MCG tablet Take 88 mcg by mouth daily before breakfast.    .  vitamin B-12 (CYANOCOBALAMIN) 100 MCG tablet Take 1 tablet (100 mcg total) by mouth daily. (Patient not taking: Reported on 08/13/2016) 30 tablet    No current facility-administered medications on file prior to visit.     Pulse (!) 55   Temp 98.5 F (36.9 C) (Oral)   Resp 16   Ht 5' 4"  (1.626 m)   Wt 213 lb 6.4 oz (96.8 kg)   SpO2 96% Comment: room air  BMI 36.63 kg/m       Objective:   Physical Exam  Constitutional: She appears well-developed and well-nourished.  Cardiovascular: Normal rate, regular rhythm and normal heart sounds.   No murmur heard. Pulmonary/Chest: Effort normal and breath sounds normal. No respiratory distress. She has no wheezes.  Musculoskeletal:  No joint erythema or swelling noted  Skin:  Mild facial acne, facial hirsutism   Psychiatric: She has a normal mood and affect. Her behavior is normal. Judgment and thought content normal.          Assessment & Plan:  Declines flu shot today.   Acne- rx with benzamycin gel.  Joint pain- will check ANA,RA, ESR to assess for autoimmune etiology  Fatigue- check CBC with diff, b12.  Vit D deficiency- check vit D level.  Hirsutism- will refer her back to Dr. Valentino Saxon for further evaluation, has implanon.

## 2016-08-13 NOTE — Patient Instructions (Addendum)
Please complete lab work prior to leaving. For the acne (pimples on your face), please begin benzamycin gel (sent to your pharmacy). Apply twice daily.  Let me know if this does not help and we will set you up with dermatology. For the hair on your face, I will try to get you back in with Dr. Algie CofferFogelman so she can look at your hormones.

## 2016-08-13 NOTE — Progress Notes (Signed)
Pre visit review using our clinic review tool, if applicable. No additional management support is needed unless otherwise documented below in the visit note. 

## 2016-08-14 LAB — ANA: Anti Nuclear Antibody(ANA): NEGATIVE

## 2016-08-14 LAB — RHEUMATOID FACTOR

## 2016-08-15 ENCOUNTER — Telehealth: Payer: Self-pay | Admitting: Family

## 2016-08-15 DIAGNOSIS — E039 Hypothyroidism, unspecified: Secondary | ICD-10-CM

## 2016-08-15 DIAGNOSIS — E559 Vitamin D deficiency, unspecified: Secondary | ICD-10-CM

## 2016-08-15 MED ORDER — LEVOTHYROXINE SODIUM 112 MCG PO TABS: 112.0000 ug | ORAL_TABLET | Freq: Every day | ORAL | 3 refills | Status: DC

## 2016-08-15 NOTE — Telephone Encounter (Signed)
Notified pt's spouse and he voices understanding. Scheduled TSH for 09/26/16 at 8:30am and Vitamin D level for 11/03/16 at 8:30am.  Future orders entered.

## 2016-08-15 NOTE — Telephone Encounter (Signed)
Autoimmune testing is negative (lupus and rheumatoid testing).  Vit d is low.  Advise patient to begin vit D 50000 units once weekly for 12 weeks, then repeat vit D level (dx Vit D deficiency).    Thyroid medication needs to be increased to 112 mcg. I would like her to repeat TSH in 6 weeks.  Other labs look good.

## 2016-09-26 ENCOUNTER — Other Ambulatory Visit (INDEPENDENT_AMBULATORY_CARE_PROVIDER_SITE_OTHER): Payer: PPO

## 2016-09-26 ENCOUNTER — Telehealth: Payer: Self-pay | Admitting: Family

## 2016-09-26 DIAGNOSIS — E039 Hypothyroidism, unspecified: Secondary | ICD-10-CM | POA: Diagnosis not present

## 2016-09-26 LAB — TSH: TSH: 2.55 u[IU]/mL (ref 0.35–4.50)

## 2016-09-26 NOTE — Telephone Encounter (Signed)
Left detailed message on spouse's voicemail and to call if any questions.

## 2016-09-26 NOTE — Telephone Encounter (Signed)
Thyroid testing looks good. Please continue current dose of synthroid.

## 2016-09-28 ENCOUNTER — Encounter: Payer: Self-pay | Admitting: Family

## 2016-09-28 ENCOUNTER — Ambulatory Visit (INDEPENDENT_AMBULATORY_CARE_PROVIDER_SITE_OTHER): Payer: PPO | Admitting: Family

## 2016-09-28 VITALS — BP 102/70 | HR 59 | Temp 98.4°F | Resp 16 | Ht 64.0 in | Wt 213.4 lb

## 2016-09-28 DIAGNOSIS — K219 Gastro-esophageal reflux disease without esophagitis: Secondary | ICD-10-CM

## 2016-09-28 DIAGNOSIS — Z23 Encounter for immunization: Secondary | ICD-10-CM | POA: Diagnosis not present

## 2016-09-28 DIAGNOSIS — R196 Halitosis: Secondary | ICD-10-CM

## 2016-09-28 DIAGNOSIS — R1013 Epigastric pain: Secondary | ICD-10-CM | POA: Diagnosis not present

## 2016-09-28 LAB — CBC WITH DIFFERENTIAL/PLATELET
BASOS PCT: 0.4 % (ref 0.0–3.0)
Basophils Absolute: 0 10*3/uL (ref 0.0–0.1)
EOS ABS: 0.1 10*3/uL (ref 0.0–0.7)
EOS PCT: 1.4 % (ref 0.0–5.0)
HEMATOCRIT: 39.4 % (ref 36.0–46.0)
HEMOGLOBIN: 13.4 g/dL (ref 12.0–15.0)
Lymphocytes Relative: 38.2 % (ref 12.0–46.0)
Lymphs Abs: 2.3 10*3/uL (ref 0.7–4.0)
MCHC: 33.9 g/dL (ref 30.0–36.0)
MCV: 80.6 fl (ref 78.0–100.0)
MONO ABS: 0.4 10*3/uL (ref 0.1–1.0)
Monocytes Relative: 6.8 % (ref 3.0–12.0)
NEUTROS ABS: 3.2 10*3/uL (ref 1.4–7.7)
Neutrophils Relative %: 53.2 % (ref 43.0–77.0)
PLATELETS: 222 10*3/uL (ref 150.0–400.0)
RBC: 4.89 Mil/uL (ref 3.87–5.11)
RDW: 14.8 % (ref 11.5–15.5)
WBC: 6.1 10*3/uL (ref 4.0–10.5)

## 2016-09-28 LAB — HEPATIC FUNCTION PANEL
ALBUMIN: 4.1 g/dL (ref 3.5–5.2)
ALK PHOS: 53 U/L (ref 39–117)
ALT: 12 U/L (ref 0–35)
AST: 16 U/L (ref 0–37)
Bilirubin, Direct: 0 mg/dL (ref 0.0–0.3)
TOTAL PROTEIN: 7.3 g/dL (ref 6.0–8.3)
Total Bilirubin: 0.4 mg/dL (ref 0.2–1.2)

## 2016-09-28 LAB — LIPASE: LIPASE: 24 U/L (ref 11.0–59.0)

## 2016-09-28 MED ORDER — OMEPRAZOLE 40 MG PO CPDR: 40.0000 mg | DELAYED_RELEASE_CAPSULE | Freq: Every day | ORAL | 3 refills | Status: DC

## 2016-09-28 NOTE — Patient Instructions (Signed)
Please complete lab work prior to leaving. Begin prilosec (acid medicine) once daily. Call if new/worsening symptoms.

## 2016-09-28 NOTE — Progress Notes (Signed)
Subjective:    Patient ID: Melissa [name], female    DOB: 06/04/1986, 30 y.o.   MRN: [id number]  HPI  Ms.  [name] is a 30 yr old female who presents today with chief complaint of burning pain located in the center of her chest. Notices most first thing in the AM, and at the end of the night.  Denies associated nausea.  Notes that Eggplant worsens her symptoms. Reports stools are normal. Denies black/bloody stools.  Denies use of NSAIDS or aspirin containing products. She presents with her husband today who assists with interpretation.   She also reports concern re: halitosis. Reports that she is up to date on dental care. Dentist did tell her that she has gum disease. Reports that she brushes/flosses regularly.   Review of Systems See HPI  Past Medical History:  Diagnosis Date  . Hyperthyroidism      Social History   Social History  . Marital status: Married    Spouse name: N/A  . Number of children: N/A  . Years of education: N/A   Occupational History  . Not on file.   Social History Main Topics  . Smoking status: Never Smoker  . Smokeless tobacco: Never Used  . Alcohol use No  . Drug use: No  . Sexual activity: Not on file   Other Topics Concern  . Not on file   Social History Narrative   [place]- originally- husband is a Consulting civil engineerstudent here   Programme researcher, broadcasting/film/videoHomemaker   Completed bachelors (physics education)   Son born 2016   Son born 2013   Enjoys video games social media    Past Surgical History:  Procedure Laterality Date  . CESAREAN SECTION    . CESAREAN SECTION N/A 04/29/2015   Procedure: CESAREAN SECTION;  Surgeon: Noland FordyceKelly Fogleman, MD;  Location: [facility];  Service: Obstetrics;  Laterality: N/A;  . EYE SURGERY     Lasik    Family History  Problem Relation Age of Onset  . Diabetes Mother   . Thyroid disease Mother   . Diabetes Father   . Hypertension Father   . Hyperlipidemia Father     No Known Allergies  Current Outpatient Prescriptions on File Prior to  Visit  Medication Sig Dispense Refill  . benzoyl peroxide-erythromycin (BENZAMYCIN) gel Apply topically 2 (two) times daily. 23.3 g 0  . levothyroxine (SYNTHROID, LEVOTHROID) 112 MCG tablet Take 1 tablet (112 mcg total) by mouth daily. 30 tablet 3   No current facility-administered medications on file prior to visit.     BP 102/70 (BP Location: Left Arm, Cuff Size: Large)   Pulse (!) 59   Temp 98.4 F (36.9 C) (Oral)   Resp 16   Ht 5\' 4"  (1.626 m)   Wt 213 lb 6.4 oz (96.8 kg)   SpO2 98% Comment: room air  BMI 36.63 kg/m       Objective:   Physical Exam  Constitutional: She is oriented to person, place, and time. She appears well-developed and well-nourished.  HENT:  Head: Normocephalic and atraumatic.  Cardiovascular: Normal rate, regular rhythm and normal heart sounds.   No murmur heard. Pulmonary/Chest: Effort normal and breath sounds normal. No respiratory distress. She has no wheezes.  Abdominal: Soft. There is tenderness in the right upper quadrant, epigastric area and left upper quadrant.  Musculoskeletal: She exhibits no edema.  Neurological: She is alert and oriented to person, place, and time.  Psychiatric: She has a normal mood and affect. Her behavior is normal.  Judgment and thought content normal.          Assessment & Plan:  I offered to request an interpretor next visit but they decline, "we are fine."   GERD/abdominal pain- symptoms most consistent with GERD, but due to abdominal tenderness, will obtain an abdominal US to assess for cholecystis as well as a lipase to assess for pancreatitis. CBC to assess for anemia. Check H pylori. Will also begin prilosec.  Follow up in 1 month. Flu shot today.   Halitosis- will see if PPI helps this. Advised pt to continue good dental hygeine, add mouthwash bid, stay well hydrated.

## 2016-09-28 NOTE — Progress Notes (Signed)
Pre visit review using our clinic review tool, if applicable. No additional management support is needed unless otherwise documented below in the visit note. 

## 2016-10-01 ENCOUNTER — Other Ambulatory Visit: Payer: Self-pay | Admitting: Family

## 2016-10-01 LAB — H. PYLORI BREATH TEST: H. PYLORI BREATH TEST: DETECTED — AB

## 2016-10-01 NOTE — Telephone Encounter (Signed)
Please contact pt's husband and let him know that I reviewed her lab work.  It is + for H pylori which is a bacteria in the stomach.  She already has rx for omeprazole 40mg .  I would like her to increase to bid x 14 days then stop omeprazole.  Also add amoxicillin and clarithromycin for 14 days. Liver and blood count look good.

## 2016-10-02 NOTE — Telephone Encounter (Signed)
Left message for spouse to return my call. 

## 2016-10-08 NOTE — Telephone Encounter (Signed)
Attempted to reach pt's spouse and left message for him to return my call.

## 2016-10-10 MED ORDER — AMOXICILLIN 500 MG PO CAPS: 500.0000 mg | ORAL_CAPSULE | Freq: Two times a day (BID) | ORAL | 0 refills | Status: AC

## 2016-10-10 MED ORDER — CLARITHROMYCIN 500 MG PO TABS: 500.0000 mg | ORAL_TABLET | Freq: Two times a day (BID) | ORAL | 0 refills | Status: AC

## 2016-10-10 MED ORDER — OMEPRAZOLE 40 MG PO CPDR: 40.0000 mg | DELAYED_RELEASE_CAPSULE | Freq: Two times a day (BID) | ORAL | 3 refills | Status: DC

## 2016-10-10 NOTE — Telephone Encounter (Signed)
Melissa [name]  Melissa [name] Cc: Melissa [name], Melissa [name]        [facility] to schedule [facility].   Thanks,  Melissa [name]     Mychart message sent to pt since we have not been able to reach her spouse by phone re: u/s and below results.

## 2016-10-15 ENCOUNTER — Ambulatory Visit: Payer: PPO | Admitting: Family

## 2016-10-15 DIAGNOSIS — Z0289 Encounter for other administrative examinations: Secondary | ICD-10-CM

## 2016-10-16 ENCOUNTER — Telehealth: Payer: Self-pay | Admitting: Family

## 2016-10-16 NOTE — Telephone Encounter (Signed)
Patient was a No Show on 10/15/2016. Previous No Shows on 9/11 and 5/17. Charge or No charge?

## 2016-10-16 NOTE — Telephone Encounter (Signed)
Yes please

## 2016-10-31 ENCOUNTER — Encounter: Payer: Self-pay | Admitting: *Deleted

## 2016-10-31 ENCOUNTER — Telehealth: Payer: Self-pay | Admitting: *Deleted

## 2016-10-31 ENCOUNTER — Ambulatory Visit: Payer: PPO | Admitting: Family

## 2016-10-31 DIAGNOSIS — Z0289 Encounter for other administrative examinations: Secondary | ICD-10-CM

## 2016-10-31 NOTE — Telephone Encounter (Signed)
Received notifications that pt has not read mychart message regarding last labs and u/s order. Mailed letter to pt.

## 2016-11-01 ENCOUNTER — Encounter: Payer: Self-pay | Admitting: Family

## 2016-11-07 ENCOUNTER — Other Ambulatory Visit (INDEPENDENT_AMBULATORY_CARE_PROVIDER_SITE_OTHER): Payer: PPO

## 2016-11-07 ENCOUNTER — Other Ambulatory Visit: Payer: PPO

## 2016-11-07 ENCOUNTER — Telehealth: Payer: Self-pay | Admitting: Family

## 2016-11-07 ENCOUNTER — Ambulatory Visit (HOSPITAL_BASED_OUTPATIENT_CLINIC_OR_DEPARTMENT_OTHER)
Admission: RE | Admit: 2016-11-07 | Discharge: 2016-11-07 | Disposition: A | Discharge status: Home / Self Care | Payer: PPO | Source: Ambulatory Visit | Attending: Family | Admitting: Family

## 2016-11-07 DIAGNOSIS — R1013 Epigastric pain: Secondary | ICD-10-CM | POA: Insufficient documentation

## 2016-11-07 DIAGNOSIS — E559 Vitamin D deficiency, unspecified: Secondary | ICD-10-CM | POA: Diagnosis not present

## 2016-11-07 DIAGNOSIS — R110 Nausea: Secondary | ICD-10-CM

## 2016-11-07 LAB — VITAMIN D 25 HYDROXY (VIT D DEFICIENCY, FRACTURES): VITD: 17.97 ng/mL — ABNORMAL LOW (ref 30.00–100.00)

## 2016-11-07 MED ORDER — RANITIDINE HCL 150 MG PO CAPS: 150.0000 mg | ORAL_CAPSULE | Freq: Two times a day (BID) | ORAL | 2 refills | Status: DC

## 2016-11-07 NOTE — Addendum Note (Signed)
Addended by: Sandford Craze'SULLIVAN, Germain Koopmann on: 11/07/2016 12:58 PM   Modules accepted: Orders

## 2016-11-07 NOTE — Telephone Encounter (Signed)
Left message for pt to return my call.

## 2016-11-07 NOTE — Telephone Encounter (Signed)
Also, Vitamin D level is low.  Advise patient to begin vit D 50000 units once weekly for 12 weeks, then repeat vit D level (dx Vit D deficiency).    

## 2016-11-07 NOTE — Addendum Note (Signed)
Addended by: Sandford Craze'SULLIVAN, Kahlel Peake on: 11/07/2016 04:11 PM   Modules accepted: Orders

## 2016-11-07 NOTE — Telephone Encounter (Signed)
Abdominal US looks good.  I would recommend that she see GI.

## 2016-11-07 NOTE — Telephone Encounter (Signed)
D/c omeprazole, begin zantac.  I will let her know how her ultrasound looks- results are not yet back.

## 2016-11-07 NOTE — Telephone Encounter (Signed)
Opened in error

## 2016-11-07 NOTE — Addendum Note (Signed)
Addended by: Sandford Craze'SULLIVAN, Kerby Hockley on: 11/07/2016 03:51 PM   Modules accepted: Orders

## 2016-11-07 NOTE — Telephone Encounter (Signed)
Melissa-- please advise? 

## 2016-11-07 NOTE — Telephone Encounter (Signed)
Patient's husband stated that patient stopped taking the medication for the problem with her stomach because it was giving her nausea. Patient's husband does not know the name of the medications but states it was two medications a capsule and tablet.   Patient phone: [phone]

## 2016-11-09 NOTE — Telephone Encounter (Signed)
Left message for pt's spouse to return my call. 

## 2016-11-14 ENCOUNTER — Encounter: Payer: Self-pay | Admitting: Family

## 2016-11-14 MED ORDER — VITAMIN D (ERGOCALCIFEROL) 1.25 MG (50000 UNIT) PO CAPS: 50000.0000 [IU] | ORAL_CAPSULE | ORAL | 0 refills | Status: DC

## 2016-11-14 NOTE — Addendum Note (Signed)
Addended by: Mervin KungFERGERSON, Dedra Matsuo A on: 11/14/2016 03:57 PM   Modules accepted: Orders

## 2016-11-14 NOTE — Telephone Encounter (Signed)
Attempted to reach pt's spouse and left message to check pt's mychart acct. Message sent.

## 2016-12-19 ENCOUNTER — Other Ambulatory Visit: Payer: Self-pay | Admitting: Family

## 2016-12-27 ENCOUNTER — Telehealth: Payer: Self-pay | Admitting: Medical

## 2016-12-27 MED ORDER — OSELTAMIVIR PHOSPHATE 75 MG PO CAPS: 75.0000 mg | ORAL_CAPSULE | Freq: Every day | ORAL | 0 refills | Status: DC

## 2016-12-27 NOTE — Telephone Encounter (Signed)
Pt husband has flu. Pt not sick. No breast feeding. On ocp. Treating her preventatively.

## 2017-02-25 ENCOUNTER — Telehealth: Payer: Self-pay | Admitting: *Deleted

## 2017-02-25 NOTE — Telephone Encounter (Signed)
Received fax from [facility] requesting refill of vitamin D 50,000 units once a week. Pt last given vit D 11/07/17 and advised to schedule lab appt in 3 months to reassess vitamin D. See 11/07/16 phone note. Denial faxed to pharmacy and mychart message sent to pt.

## 2017-04-01 ENCOUNTER — Encounter: Payer: Self-pay | Admitting: Family

## 2017-04-01 ENCOUNTER — Ambulatory Visit (INDEPENDENT_AMBULATORY_CARE_PROVIDER_SITE_OTHER): Payer: PPO | Admitting: Family

## 2017-04-01 VITALS — BP 113/62 | HR 67 | Temp 98.4°F | Resp 18 | Ht 64.0 in | Wt 211.8 lb

## 2017-04-01 DIAGNOSIS — M2550 Pain in unspecified joint: Secondary | ICD-10-CM | POA: Diagnosis not present

## 2017-04-01 LAB — BASIC METABOLIC PANEL
BUN: 23 mg/dL (ref 6–23)
CHLORIDE: 105 meq/L (ref 96–112)
CO2: 23 meq/L (ref 19–32)
Calcium: 9.7 mg/dL (ref 8.4–10.5)
Creatinine, Ser: 0.68 mg/dL (ref 0.40–1.20)
GFR: 107.33 mL/min (ref >60.00)
GLUCOSE: 90 mg/dL (ref 70–99)
POTASSIUM: 3.8 meq/L (ref 3.5–5.1)
SODIUM: 138 meq/L (ref 135–145)

## 2017-04-01 LAB — CBC WITH DIFFERENTIAL/PLATELET
Basophils Absolute: 0 10*3/uL (ref 0.0–0.1)
Basophils Relative: 0.4 % (ref 0.0–3.0)
EOS ABS: 0.1 10*3/uL (ref 0.0–0.7)
Eosinophils Relative: 1.6 % (ref 0.0–5.0)
HCT: 39.9 % (ref 36.0–46.0)
HEMOGLOBIN: 13.4 g/dL (ref 12.0–15.0)
LYMPHS ABS: 2.3 10*3/uL (ref 0.7–4.0)
Lymphocytes Relative: 34.7 % (ref 12.0–46.0)
MCHC: 33.6 g/dL (ref 30.0–36.0)
MCV: 82.7 fl (ref 78.0–100.0)
MONO ABS: 0.4 10*3/uL (ref 0.1–1.0)
Monocytes Relative: 6.3 % (ref 3.0–12.0)
NEUTROS PCT: 57 % (ref 43.0–77.0)
Neutro Abs: 3.7 10*3/uL (ref 1.4–7.7)
Platelets: 217 10*3/uL (ref 150.0–400.0)
RBC: 4.83 Mil/uL (ref 3.87–5.11)
RDW: 14 % (ref 11.5–15.5)
WBC: 6.5 10*3/uL (ref 4.0–10.5)

## 2017-04-01 LAB — VITAMIN B12: Vitamin B-12: 220 pg/mL (ref 211–911)

## 2017-04-01 LAB — TSH: TSH: 5.02 u[IU]/mL — AB (ref 0.35–4.50)

## 2017-04-01 LAB — FOLATE: Folate: 9.8 ng/mL (ref >5.9)

## 2017-04-01 MED ORDER — MELOXICAM 7.5 MG PO TABS
7.5000 mg | ORAL_TABLET | Freq: Every day | ORAL | 0 refills | Status: DC
Start: 2017-04-01 — End: 2017-08-02

## 2017-04-01 NOTE — Progress Notes (Signed)
Pre visit review using our clinic review tool, if applicable. No additional management support is needed unless otherwise documented below in the visit note. 

## 2017-04-01 NOTE — Patient Instructions (Addendum)
Begin meloxicam (anti-inflammatory) for pain once daily. I gave you a 2 week supply. Perform back exercises 2 times daily. Call if new/worsening pain.    Back Exercises If you have pain in your back, do these exercises 2-3 times each day or as told by your doctor. When the pain goes away, do the exercises once each day, but repeat the steps more times for each exercise (do more repetitions). If you do not have pain in your back, do these exercises once each day or as told by your doctor. Exercises Single Knee to Chest   Do these steps 3-5 times in a row for each leg: 1. Lie on your back on a firm bed or the floor with your legs stretched out. 2. Bring one knee to your chest. 3. Hold your knee to your chest by grabbing your knee or thigh. 4. Pull on your knee until you feel a gentle stretch in your lower back. 5. Keep doing the stretch for 10-30 seconds. 6. Slowly let go of your leg and straighten it. Pelvic Tilt   Do these steps 5-10 times in a row: 1. Lie on your back on a firm bed or the floor with your legs stretched out. 2. Bend your knees so they point up to the ceiling. Your feet should be flat on the floor. 3. Tighten your lower belly (abdomen) muscles to press your lower back against the floor. This will make your tailbone point up to the ceiling instead of pointing down to your feet or the floor. 4. Stay in this position for 5-10 seconds while you gently tighten your muscles and breathe evenly. Cat-Cow   Do these steps until your lower back bends more easily: 1. Get on your hands and knees on a firm surface. Keep your hands under your shoulders, and keep your knees under your hips. You may put padding under your knees. 2. Let your head hang down, and make your tailbone point down to the floor so your lower back is round like the back of a cat. 3. Stay in this position for 5 seconds. 4. Slowly lift your head and make your tailbone point up to the ceiling so your back hangs low  (sags) like the back of a cow. 5. Stay in this position for 5 seconds. Press-Ups   Do these steps 5-10 times in a row: 1. Lie on your belly (face-down) on the floor. 2. Place your hands near your head, about shoulder-width apart. 3. While you keep your back relaxed and keep your hips on the floor, slowly straighten your arms to raise the top half of your body and lift your shoulders. Do not use your back muscles. To make yourself more comfortable, you may change where you place your hands. 4. Stay in this position for 5 seconds. 5. Slowly return to lying flat on the floor. Bridges   Do these steps 10 times in a row: 1. Lie on your back on a firm surface. 2. Bend your knees so they point up to the ceiling. Your feet should be flat on the floor. 3. Tighten your butt muscles and lift your butt off of the floor until your waist is almost as high as your knees. If you do not feel the muscles working in your butt and the back of your thighs, slide your feet 1-2 inches farther away from your butt. 4. Stay in this position for 3-5 seconds. 5. Slowly lower your butt to the floor, and let your butt  muscles relax. If this exercise is too easy, try doing it with your arms crossed over your chest. Belly Crunches   Do these steps 5-10 times in a row: 1. Lie on your back on a firm bed or the floor with your legs stretched out. 2. Bend your knees so they point up to the ceiling. Your feet should be flat on the floor. 3. Cross your arms over your chest. 4. Tip your chin a little bit toward your chest but do not bend your neck. 5. Tighten your belly muscles and slowly raise your chest just enough to lift your shoulder blades a tiny bit off of the floor. 6. Slowly lower your chest and your head to the floor. Back Lifts  Do these steps 5-10 times in a row: 1. Lie on your belly (face-down) with your arms at your sides, and rest your forehead on the floor. 2. Tighten the muscles in your legs and your  butt. 3. Slowly lift your chest off of the floor while you keep your hips on the floor. Keep the back of your head in line with the curve in your back. Look at the floor while you do this. 4. Stay in this position for 3-5 seconds. 5. Slowly lower your chest and your face to the floor. Contact a doctor if:  Your back pain gets a lot worse when you do an exercise.  Your back pain does not lessen 2 hours after you exercise. If you have any of these problems, stop doing the exercises. Do not do them again unless your doctor says it is okay. Get help right away if:  You have sudden, very bad back pain. If this happens, stop doing the exercises. Do not do them again unless your doctor says it is okay. This information is not intended to replace advice given to you by your health care provider. Make sure you discuss any questions you have with your health care provider. Document Released: 12/22/2010 Document Revised: 04/26/2016 Document Reviewed: 01/13/2015 Elsevier Interactive Patient Education  2017 [employer].

## 2017-04-01 NOTE — Progress Notes (Signed)
Subjective:    Patient ID: Melissa [name], female    DOB: 23-Jul-1986, 31 y.o.   MRN: [id number]  HPI  Melissa [name] is a 31 yr old female who presents today to discuss pain.  Husband assists with translation. Pain is located in her legs and hands. She reports pain in both feet, also in both hands.  Denies numbness/tingling.  Feels like it is hard to grip things.  Pain has been present for 1 week.  She has not tried any otc meds.  Pain is worsened by fatigue.   GERD- last visit noted gerd symptoms. She stopped medication. Denies current pain.  Denies gerd pain  She does have some low back pain.  Pain is worse when she tries to stand from a sitting position on the floor.   Review of Systems See HPI  Past Medical History:  Diagnosis Date  . Hyperthyroidism      Social History   Social History  . Marital status: Married    Spouse name: N/A  . Number of children: N/A  . Years of education: N/A   Occupational History  . Not on file.   Social History Main Topics  . Smoking status: Never Smoker  . Smokeless tobacco: Never Used  . Alcohol use No  . Drug use: No  . Sexual activity: Not on file   Other Topics Concern  . Not on file   Social History Narrative   [place]- originally- husband is a Consulting civil engineer here   Programme researcher, broadcasting/film/video (physics education)   Son born 2016   Son born 2013   Enjoys video games social media    Past Surgical History:  Procedure Laterality Date  . CESAREAN SECTION    . CESAREAN SECTION N/A 04/29/2015   Procedure: CESAREAN SECTION;  Surgeon: Noland Fordyce, MD;  Location: [facility];  Service: Obstetrics;  Laterality: N/A;  . EYE SURGERY     Lasik    Family History  Problem Relation Age of Onset  . Diabetes Mother   . Thyroid disease Mother   . Diabetes Father   . Hypertension Father   . Hyperlipidemia Father     No Known Allergies  Current Outpatient Prescriptions on File Prior to Visit  Medication Sig Dispense Refill  .  levothyroxine (SYNTHROID, LEVOTHROID) 112 MCG tablet TAKE 1 TABLET (112 MCG TOTAL) BY MOUTH DAILY. 30 tablet 2   No current facility-administered medications on file prior to visit.     BP 113/62 (BP Location: Left Arm, Cuff Size: Large)   Pulse 67   Temp 98.4 F (36.9 C) (Oral)   Resp 18   Ht  (1.626 m)   Wt 211 lb 12.8 oz (96.1 kg)   SpO2 98% Comment: room air  BMI 36.36 kg/m       Objective:   Physical Exam  Constitutional: She appears well-developed and well-nourished.  Cardiovascular: Normal rate, regular rhythm and normal heart sounds.   No murmur heard. Pulmonary/Chest: Effort normal and breath sounds normal. No respiratory distress. She has no wheezes.  Musculoskeletal:       Cervical back: She exhibits no tenderness.       Thoracic back: She exhibits no tenderness.       Lumbar back: She exhibits no tenderness.  Neg tinels, neg phalans  Neurological:  Reflex Scores:      Patellar reflexes are 2+ on the right side and 2+ on the left side. Bilateral LE strength is 5/5  Psychiatric: She  has a normal mood and affect. Her behavior is normal. Judgment and thought content normal.          Assessment & Plan:  Back pain- rx with trial of meloxicam as well as back exercises as outlined in AVS.    Joint pain- ? OA, also need to rule out autoimmune etiology. Obtain ana, b12, bmet, cbc, folate, RA,TSH and Vit D. Meloxicam may help as well.

## 2017-04-02 LAB — RHEUMATOID FACTOR: Rhuematoid fact SerPl-aCnc: <14 IU/mL (ref <14)

## 2017-04-02 LAB — ANA: Anti Nuclear Antibody(ANA): NEGATIVE

## 2017-04-06 LAB — VITAMIN D 1,25 DIHYDROXY
VITAMIN D 1, 25 (OH) TOTAL: 63 pg/mL (ref 18–72)
Vitamin D2 1, 25 (OH)2: 55 pg/mL
Vitamin D3 1, 25 (OH)2: 8 pg/mL

## 2017-04-08 ENCOUNTER — Other Ambulatory Visit: Payer: Self-pay | Admitting: Family

## 2017-04-08 DIAGNOSIS — E039 Hypothyroidism, unspecified: Secondary | ICD-10-CM

## 2017-04-08 NOTE — Telephone Encounter (Signed)
Also, vit D is normal and blood count is normal.

## 2017-04-08 NOTE — Telephone Encounter (Signed)
Please contact patient's husband and let him know that her autoimmune testing is normal. B12 is low normal.  Given her fatigue, I would recommend that she start b12 shots. IM weekly x 4 weeks then monthly.  I would also recommend that we increase her synthroid to 125 mcg once daily. Repeat TSH in 6 weeks. (Take synthroid in AM on empty stomach please).

## 2017-04-10 NOTE — Telephone Encounter (Signed)
Left message for pt's husband to return my call

## 2017-04-18 MED ORDER — LEVOTHYROXINE SODIUM 125 MCG PO TABS: 125.0000 ug | ORAL_TABLET | Freq: Every day | ORAL | 3 refills | Status: DC

## 2017-04-18 NOTE — Telephone Encounter (Signed)
Mychart message sent regarding results

## 2017-08-02 ENCOUNTER — Ambulatory Visit (INDEPENDENT_AMBULATORY_CARE_PROVIDER_SITE_OTHER): Payer: PPO | Admitting: Family

## 2017-08-02 VITALS — BP 109/70 | HR 65 | Temp 98.7°F | Resp 16 | Ht 64.0 in | Wt 217.0 lb

## 2017-08-02 DIAGNOSIS — E039 Hypothyroidism, unspecified: Secondary | ICD-10-CM

## 2017-08-02 DIAGNOSIS — R609 Edema, unspecified: Secondary | ICD-10-CM

## 2017-08-02 DIAGNOSIS — G5603 Carpal tunnel syndrome, bilateral upper limbs: Secondary | ICD-10-CM

## 2017-08-02 LAB — TSH: TSH: 1.08 u[IU]/mL (ref 0.35–4.50)

## 2017-08-02 MED ORDER — MELOXICAM 7.5 MG PO TABS: 7.5000 mg | ORAL_TABLET | Freq: Every day | ORAL | 0 refills | Status: DC

## 2017-08-02 NOTE — Progress Notes (Signed)
Pt doesn't speak english well and has her husband here to help with translating

## 2017-08-02 NOTE — Progress Notes (Signed)
Subjective:    Patient ID: Melissa [name], female    DOB: [birth year], 31 y.o.   MRN: [id number]  HPI   Pt presents today with complaint of bilateral hand and forearm numbness.  Has been present x 2-3 months.  Husband notes hand and leg swelling with pain.  Meloxicam which did not seem to help with her pain. Last visit ESR, ANA Rheumatoid factor all negative.  Hypothyroid- last visit TSH was elevated and we increased her synthroid.  Edema- notes swelling in bilateral hands/legs. Husband notes that patient really likes to add salt to her foods. Notes that LE edema is often worse at the end of the day. Denies SOB>   Review of Systems See HPI  Past Medical History:  Diagnosis Date  . Hyperthyroidism      Social History   Social History  . Marital status: Married    Spouse name: N/A  . Number of children: N/A  . Years of education: N/A   Occupational History  . Not on file.   Social History Main Topics  . Smoking status: Never Smoker  . Smokeless tobacco: Never Used  . Alcohol use No  . Drug use: No  . Sexual activity: Not on file   Other Topics Concern  . 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 CESAREAN SECTION    . CESAREAN SECTION N/A 04/29/2015   Procedure: CESAREAN SECTION;  Surgeon: Aloha Gell, MD;  Location: [facility];  Service: Obstetrics;  Laterality: N/A;  . EYE SURGERY     Lasik    Family History  Problem Relation Age of Onset  . Diabetes Mother   . Thyroid disease Mother   . Diabetes Father   . Hypertension Father   . Hyperlipidemia Father     No Known Allergies  Current Outpatient Prescriptions on File Prior to Visit  Medication Sig Dispense Refill  . levothyroxine (SYNTHROID, LEVOTHROID) 125 MCG tablet Take  1 tablet (125 mcg total) by mouth daily. 30 tablet 3   No current facility-administered medications on file prior to visit.     BP 109/70   Pulse 65   Temp 98.7 F (37.1 C) (Oral)   Resp 16   Ht 5' 4"  (1.626 m)   Wt 217 lb (98.4 kg)   SpO2 100%   BMI 37.25 kg/m       Objective:   Physical Exam  Constitutional: She appears well-developed and well-nourished.  Cardiovascular: Normal rate, regular rhythm and normal heart sounds.   No murmur heard. Pulmonary/Chest: Effort normal and breath sounds normal. No respiratory distress. She has no wheezes.  Musculoskeletal:  Trace bilateral LE edema, no hand swelling noted.   Neurological:  Neg tinels bilaterally, + Phalans bilaterally  Psychiatric: She has a normal mood and affect. Her behavior is normal. Judgment and thought content normal.          Assessment & Plan:  Carpal tunnel syndrome- pt provided with bilateral wrist splints. Advised pt as follows:   estart meloxicam once daily for next 2 weeks. Wear wrist braces at night while sleeping and as you are able during the day.   Call me or send me a mychart message if your hand pain/numbness is  not improved in 1 month and I will refer you to a specialist.  Edema- suspect related to mild venous insufficiency- advised pt as follows:   You can try purchasing over the counter compression stockings to wear during the day to help with your swelling. Try to limit the sodium in your diet and elevated your feet when you are able.   Hypothyroid- obtain follow up TSH.

## 2017-08-02 NOTE — Patient Instructions (Signed)
Restart meloxicam once daily for next 2 weeks. Wear wrist braces at night while sleeping and as you are able during the day.   You can try purchasing over the counter compression stockings to wear during the day to help with your swelling. Try to limit the sodium in your diet and elevated your feet when you are able. Call me or send me a mychart message if your hand pain/numbness is not improved in 1 month and I will refer you to a specialist.   Carpal Tunnel Syndrome Carpal tunnel syndrome is a condition that causes pain in your hand and arm. The carpal tunnel is a narrow area located on the palm side of your wrist. Repeated wrist motion or certain diseases may cause swelling within the tunnel. This swelling pinches the main nerve in the wrist (median nerve). What are the causes? This condition may be caused by:  Repeated wrist motions.  Wrist injuries.  Arthritis.  A cyst or tumor in the carpal tunnel.  Fluid buildup during pregnancy.  Sometimes the cause of this condition is not known. What increases the risk? This condition is more likely to develop in:  People who have jobs that cause them to repeatedly move their wrists in the same motion, such as Health visitor.  Women.  People with certain conditions, such as: ? Diabetes. ? Obesity. ? An underactive thyroid (hypothyroidism). ? Kidney failure.  What are the signs or symptoms? Symptoms of this condition include:  A tingling feeling in your fingers, especially in your thumb, index, and middle fingers.  Tingling or numbness in your hand.  An aching feeling in your entire arm, especially when your wrist and elbow are bent for long periods of time.  Wrist pain that goes up your arm to your shoulder.  Pain that goes down into your palm or fingers.  A weak feeling in your hands. You may have trouble grabbing and holding items.  Your symptoms may feel worse during the night. How is this diagnosed? This  condition is diagnosed with a medical history and physical exam. You may also have tests, including:  An electromyogram (EMG). This test measures electrical signals sent by your nerves into the muscles.  X-rays.  How is this treated? Treatment for this condition includes:  Lifestyle changes. It is important to stop doing or modify the activity that caused your condition.  Physical or occupational therapy.  Medicines for pain and inflammation. This may include medicine that is injected into your wrist.  A wrist splint.  Surgery.  Follow these instructions at home: If you have a splint:  Wear it as told by your health care provider. Remove it only as told by your health care provider.  Loosen the splint if your fingers become numb and tingle, or if they turn cold and blue.  Keep the splint clean and dry. General instructions  Take over-the-counter and prescription medicines only as told by your health care provider.  Rest your wrist from any activity that may be causing your pain. If your condition is work related, talk to your employer about changes that can be made, such as getting a wrist pad to use while typing.  If directed, apply ice to the painful area: ? Put ice in a plastic bag. ? Place a towel between your skin and the bag. ? Leave the ice on for 20 minutes, 2-3 times per day.  Keep all follow-up visits as told by your health care provider. This is important.  Do any exercises as told by your health care provider, physical therapist, or occupational therapist. Contact a health care provider if:  You have new symptoms.  Your pain is not controlled with medicines.  Your symptoms get worse. This information is not intended to replace advice given to you by your health care provider. Make sure you discuss any questions you have with your health care provider. Document Released: 11/16/2000 Document Revised: 03/29/2016 Document Reviewed: 04/06/2015 Elsevier  Interactive Patient Education  2017 [employer]

## 2017-08-02 NOTE — Addendum Note (Signed)
Addended by: Verdie ShireBAYNES, Staysha Truby M on: 08/02/2017 10:15 AM   Modules accepted: Orders

## 2017-08-25 ENCOUNTER — Other Ambulatory Visit: Payer: Self-pay | Admitting: Family

## 2017-10-02 ENCOUNTER — Ambulatory Visit (INDEPENDENT_AMBULATORY_CARE_PROVIDER_SITE_OTHER): Payer: PPO | Admitting: Family Medicine

## 2017-10-02 VITALS — BP 110/72 | HR 57 | Temp 98.0°F | Ht 64.0 in | Wt 217.8 lb

## 2017-10-02 DIAGNOSIS — R05 Cough: Secondary | ICD-10-CM

## 2017-10-02 DIAGNOSIS — R059 Cough, unspecified: Secondary | ICD-10-CM

## 2017-10-02 DIAGNOSIS — J209 Acute bronchitis, unspecified: Secondary | ICD-10-CM

## 2017-10-02 MED ORDER — BENZONATATE 100 MG PO CAPS: 100.0000 mg | ORAL_CAPSULE | Freq: Three times a day (TID) | ORAL | 0 refills | Status: DC | PRN

## 2017-10-02 MED ORDER — AZITHROMYCIN 250 MG PO TABS: ORAL_TABLET | ORAL | 0 refills | Status: DC

## 2017-10-02 MED ORDER — HYDROCODONE-HOMATROPINE 5-1.5 MG/5ML PO SYRP: 5.0000 mL | ORAL_SOLUTION | Freq: Every evening | ORAL | 0 refills | Status: DC | PRN

## 2017-10-02 NOTE — Patient Instructions (Signed)
It was good to see you today- I hope that you feel much better soon Please use the azithromycin antibiotic as directed Use the tessalon perles for cough during the day At bedtime you can take the cough syrup- however this will make you drowsy  If you are not improving in the next 2-3 days please let me know- sooner if getting worse It may take a few weeks however for the cough to totally clear up

## 2017-10-02 NOTE — Progress Notes (Signed)
[facility] [address] [place], [place] [address] [phone]  Date:  10/02/2017   Name:  Melissa [name]   DOB:  February 08, 1986   MRN:  [id number]  PCP:  Sandford Craze, NP    Chief Complaint: Cough (c/o cough x 1 week. Tried otc cough meds with no relief. )   History of Present Illness:  Melissa [name] is a 31 y.o. very pleasant female patient who presents with the following:  Pt of Melissa O Sullivan Generally in good health Here today with concern of cough for about one week or a bit longer  History of hypothyroidism with recent normal TSH She has a contraceptive implant  The cough does seem to be getting worse, and can be productive of mucus If she takes a deep breath she will cough The cough is keeping her up at night She has not noted any fever or chills She does have a mild ST No vomiting or diarrhea They have tried some OTC cold and flu medications but they did not help too much  She is NOT pregnant or nursing currently   They have 2 children, 5 and 2 yo at home nexplanon for contraception  Here today with her husband - he helps to give the history as pt does not speak a whole lot of English  Lab Results  Component Value Date   TSH 1.08 08/02/2017     Patient Active Problem List   Diagnosis Date Noted  . Preventative health care 12/27/2015  . Vitamin D deficiency 12/27/2015  . Hypothyroidism 05/03/2015    Past Medical History:  Diagnosis Date  . Hyperthyroidism     Past Surgical History:  Procedure Laterality Date  . CESAREAN SECTION    . CESAREAN SECTION N/A 04/29/2015   Procedure: CESAREAN SECTION;  Surgeon: Noland Fordyce, MD;  Location: [facility];  Service: Obstetrics;  Laterality: N/A;  . EYE SURGERY     Lasik    Social History  Substance Use Topics  . Smoking status: Never Smoker  . Smokeless tobacco: Never Used  . Alcohol use No    Family History  Problem Relation Age  of Onset  . Diabetes Mother   . Thyroid disease Mother   . Diabetes Father   . Hypertension Father   . Hyperlipidemia Father     No Known Allergies  Medication list has been reviewed and updated.  Current Outpatient Prescriptions on File Prior to Visit  Medication Sig Dispense Refill  . levothyroxine (SYNTHROID, LEVOTHROID) 125 MCG tablet TAKE 1 TABLET BY MOUTH EVERY DAY 30 tablet 3  . meloxicam (MOBIC) 7.5 MG tablet Take 1 tablet (7.5 mg total) by mouth daily. 14 tablet 0   No current facility-administered medications on file prior to visit.     Review of Systems:  As per HPI- otherwise negative. She does have some cough attacks    Physical Examination: Vitals:   10/02/17 1111  BP: 110/72  Pulse: (!) 57  Temp: 98 F (36.7 C)  SpO2: 99%   Vitals:   10/02/17 1111  Weight: 217 lb 12.8 oz (98.8 kg)  Height: 5\' 4"  (1.626 m)   Body mass index is 37.39 kg/m. Ideal Body Weight: Weight in (lb) to have BMI = 25: 145.3  GEN: WDWN, NAD, Non-toxic, A & O x 3, obese, otherwise looks well HEENT: Atraumatic, Normocephalic. Neck supple. No masses, No LAD. Bilateral TM wnl, oropharynx normal.  PEERL,EOMI.  Ears and Nose: No external deformity. CV: RRR, No M/G/R. No JVD. No thrill. No extra heart sounds. PULM: CTA B, no wheezes, crackles, rhonchi. No retractions. No resp. distress. No accessory muscle use. EXTR: No c/c/e NEURO Normal gait.  PSYCH: Normally interactive. Conversant. Not depressed or anxious appearing.  Calm demeanor.  Coughing some in room   Pulse Readings from Last 3 Encounters:  10/02/17 (!) 57  08/02/17 65  04/01/17 67    Assessment and Plan: Acute bronchitis, unspecified organism - Plan: azithromycin (ZITHROMAX) 250 MG tablet  Cough - Plan: benzonatate (TESSALON) 100 MG capsule, HYDROcodone-homatropine (HYCODAN) 5-1.5 MG/5ML syrup  Today with bothersome and persistent cough for 10 days Will treat with azithromycin for bronchitis, hycodan for night  time cough and tessalon perles for day  Asked them to let me know if not feeling improved in the next few days, although cough may take several weeks to totally resolve   Signed Abbe AmsterdamJessica Copland, MD

## 2018-01-03 ENCOUNTER — Other Ambulatory Visit: Payer: Self-pay | Admitting: Family

## 2018-01-03 DIAGNOSIS — E039 Hypothyroidism, unspecified: Secondary | ICD-10-CM

## 2018-02-04 ENCOUNTER — Ambulatory Visit (INDEPENDENT_AMBULATORY_CARE_PROVIDER_SITE_OTHER): Payer: PPO | Admitting: Family

## 2018-02-04 ENCOUNTER — Encounter: Payer: Self-pay | Admitting: Family

## 2018-02-04 VITALS — BP 116/75 | HR 55 | Temp 98.3°F | Resp 16 | Ht 64.0 in | Wt 220.0 lb

## 2018-02-04 DIAGNOSIS — L659 Nonscarring hair loss, unspecified: Secondary | ICD-10-CM

## 2018-02-04 DIAGNOSIS — M79672 Pain in left foot: Secondary | ICD-10-CM

## 2018-02-04 DIAGNOSIS — M79671 Pain in right foot: Secondary | ICD-10-CM

## 2018-02-04 DIAGNOSIS — G5603 Carpal tunnel syndrome, bilateral upper limbs: Secondary | ICD-10-CM | POA: Diagnosis not present

## 2018-02-04 DIAGNOSIS — R609 Edema, unspecified: Secondary | ICD-10-CM

## 2018-02-04 MED ORDER — MELOXICAM 7.5 MG PO TABS: 7.5000 mg | ORAL_TABLET | Freq: Every day | ORAL | 0 refills | Status: DC

## 2018-02-04 NOTE — Progress Notes (Signed)
Subjective:    Patient ID: Melissa [name], female    DOB: 1986-05-08, 32 y.o.   MRN: [id number]  HPI  Melissa [name] is a 32 yr old female who presents today with c/o of pain in hands and feet. Husband helps to translate today.  She reports that pain is present in the hands and feet.  Pain is sometimes pain lasts all day, sometimes pain is intermittent.  Reports + pain especially when she tries to squeeze her hands.  She denies numbness in the hands.  Has used meloxicam as needed without significant improvmenet.  Reports that she tried the wrist braces as well.  Also reports that pain is "all over the foot."  Feet hurt at rest and with weight bearing.   She also complains about some hair loss.   Review of Systems See HPI  Past Medical History:  Diagnosis Date  . Hyperthyroidism      Social History   Socioeconomic History  . Marital status: Married    Spouse name: Not on file  . Number of children: Not on file  . Years of education: Not on file  . Highest education level: Not on file  Social Needs  . Financial resource strain: Not on file  . Food insecurity - worry: Not on file  . Food insecurity - inability: Not on file  . Transportation needs - medical: Not on file  . Transportation needs - non-medical: Not on file  Occupational History  . Not on file  Tobacco Use  . Smoking status: Never Smoker  . Smokeless tobacco: Never Used  Substance and Sexual Activity  . Alcohol use: No  . Drug use: No  . Sexual activity: Not on file  Other Topics Concern  . Not on file  Social History Narrative   [place]- originally- husband is a Consulting civil engineer here   Programme researcher, broadcasting/film/video (physics education)   Son born 2016   Son born 2013   Enjoys video games social media    Past Surgical History:  Procedure Laterality Date  . CESAREAN SECTION    . CESAREAN SECTION N/A 04/29/2015   Procedure: CESAREAN SECTION;  Surgeon: Noland Fordyce, MD;  Location: [facility];  Service:  Obstetrics;  Laterality: N/A;  . EYE SURGERY     Lasik    Family History  Problem Relation Age of Onset  . Diabetes Mother   . Thyroid disease Mother   . Diabetes Father   . Hypertension Father   . Hyperlipidemia Father     No Known Allergies  Current Outpatient Medications on File Prior to Visit  Medication Sig Dispense Refill  . azithromycin (ZITHROMAX) 250 MG tablet Use as a zpack 6 tablet 0  . benzonatate (TESSALON) 100 MG capsule Take 1 capsule (100 mg total) by mouth 3 (three) times daily as needed for cough. 40 capsule 0  . HYDROcodone-homatropine (HYCODAN) 5-1.5 MG/5ML syrup Take 5 mLs by mouth at bedtime as needed for cough. 60 mL 0  . levothyroxine (SYNTHROID, LEVOTHROID) 125 MCG tablet TAKE 1 TABLET BY MOUTH EVERY DAY 30 tablet 0  . meloxicam (MOBIC) 7.5 MG tablet Take 1 tablet (7.5 mg total) by mouth daily. 14 tablet 0   No current facility-administered medications on file prior to visit.     BP 116/75 (BP Location: Left Arm, Patient Position: Sitting, Cuff Size: Large)   Pulse (!) 55   Temp 98.3 F (36.8 C) (Oral)   Resp 16   Ht 5\' 4"  (  1.626 m)   Wt 220 lb (99.8 kg)   SpO2 100%   BMI 37.76 kg/m       Objective:   Physical Exam  Constitutional: She is oriented to person, place, and time. She appears well-developed and well-nourished.  Cardiovascular: Normal rate, regular rhythm and normal heart sounds.  No murmur heard. Pulmonary/Chest: Effort normal and breath sounds normal. No respiratory distress. She has no wheezes.  Musculoskeletal:  1+ bilateral LE edema  Neurological: She is alert and oriented to person, place, and time.  + phalans and + tinnels bilaterally  Psychiatric: She has a normal mood and affect. Her behavior is normal. Judgment and thought content normal.          Assessment & Plan:  Carpal tunnel syndrome- rx meloxicam refer to sports medicine.  Bilateral foot pain- unchanged, refer to sports medicine.  Hair loss- requested  labs today, but wishes to defer to next visit when she does her lab work.   Edema- mild, likely due to venous insufficiency. Advised pt to try compression socke.

## 2018-02-04 NOTE — Patient Instructions (Addendum)
Please complete lab work prior to leaving. You will be contacted about your referral to sports medicine for the carpal tunnel and foot pain. A refill has been sent for meloxicam once daily as needed for pain. For swelling, please purchase over the counter compression stockings.  These can be purchased on [employer] or at [facility].

## 2018-02-07 ENCOUNTER — Other Ambulatory Visit: Payer: Self-pay | Admitting: Family

## 2018-02-07 DIAGNOSIS — E039 Hypothyroidism, unspecified: Secondary | ICD-10-CM

## 2018-02-07 NOTE — Telephone Encounter (Signed)
Copied from CRM [phone]. Topic: Quick Communication - Rx Refill/Question >> Feb 07, 2018  3:03 PM Rudi CocoLathan, Juandaniel Manfredo M, NT wrote: Medication:  levothyroxine (SYNTHROID, LEVOTHROID) 125 MCG tablet [[id number]][[id number]]    Has the patient contacted their pharmacy? yes   (Agent: If no, request that the patient contact the pharmacy for the refill.)   Preferred Pharmacy (with phone number or street name):[id number] [place], [place] - [address] [place] [id number] Phone: [phone] Fax: [phone]     Agent: Please be advised that RX refills may take up to 3 business days. We ask that you follow-up with your pharmacy.

## 2018-02-07 NOTE — Telephone Encounter (Signed)
Refill request  Synthroid  Before  Labs  Drawn    LOV 02-04-2018   Thyriod  Issue  Not addressed    [facility]  [address]

## 2018-02-09 MED ORDER — LEVOTHYROXINE SODIUM 125 MCG PO TABS: 125.0000 ug | ORAL_TABLET | Freq: Every day | ORAL | 2 refills | Status: DC

## 2018-02-09 NOTE — Telephone Encounter (Signed)
Refill sent but needs to return to lab please.  Orders placed at time of her last OV.

## 2018-02-10 ENCOUNTER — Ambulatory Visit: Payer: PPO | Admitting: Family Medicine

## 2018-02-10 NOTE — Telephone Encounter (Signed)
Attempted to reach pt and left message to check mychart. Message sent. 

## 2018-02-14 ENCOUNTER — Ambulatory Visit (INDEPENDENT_AMBULATORY_CARE_PROVIDER_SITE_OTHER): Payer: PPO | Admitting: Family Medicine

## 2018-02-14 ENCOUNTER — Encounter: Payer: Self-pay | Admitting: Family Medicine

## 2018-02-14 DIAGNOSIS — G609 Hereditary and idiopathic neuropathy, unspecified: Secondary | ICD-10-CM | POA: Insufficient documentation

## 2018-02-14 MED ORDER — AMITRIPTYLINE HCL 25 MG PO TABS: 25.0000 mg | ORAL_TABLET | Freq: Every day | ORAL | 2 refills | Status: DC

## 2018-02-14 NOTE — Addendum Note (Signed)
Addended byDenny Levy: Melissa [name] on: 02/14/2018 03:11 PM   Modules accepted: Level of Service

## 2018-02-14 NOTE — Progress Notes (Signed)
Today's interpreter is Rite AidEnayat Ibrahim.

## 2018-02-14 NOTE — Assessment & Plan Note (Signed)
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  No red flag symptoms on exam.  Strength was intact bilaterally.  Differential varies greatly but nerve impingement/entrapment conditions like carpal tunnel seems less likely as it seems to be affecting both sides equally and would not explain the lower extremity involvement.  Swelling reported by patient could be secondary to her thyroid disorder however most recent labs indicated TSH levels within normal range.  Could be related to vitamin deficiencies.  Psychosomatic causes seem most likely with such a chronic generalized unchanging history. -Initiating amitriptyline 25 mg at bedtime (discussed breastfeeding caution) -Strongly encouraged increasing daily activity with exercise and ambulation >> footwear seen in office was appropriate -Follow-up in 4 weeks  Next: Consideration for initiating vitamin and coenzyme supplements if symptoms respond to amitriptyline but with room for improvement

## 2018-02-14 NOTE — Progress Notes (Signed)
HPI  CC: Bilateral hand and foot pain Patient is here with complaints of bilateral foot and hand pain over the past 3-4 years.  Patient denies any history of trauma or injury.  She states that she has pain "everywhere" across her hands and feet.  No one extremity is worse than the other.  Patient was unable to fully describe the type of pain but she states that it is worse first thing in the morning.  She denies any weakness, numbness, or paresthesias.  She endorses swelling in her hands and feet.  She denies any specific timing in which her symptoms began, or any activities that make the pain worse/better.  Patient has taken Mobic for this in the past with little to no improvement.  Previous Interventions Tried: Mobic   Past Injuries: None  Past Surgeries: None  Smoking: No  Family Hx: None  ROS: Per HPI; in addition no fever, no rash, no additional weakness, no additional numbness, no additional paresthesias, and no additional falls/injury.   Objective: BP 115/71   Ht 5' 4.17" (1.63 m)   Wt 218 lb (98.9 kg)   BMI 37.22 kg/m  Gen: NAD, well groomed, a/o x3, normal affect.  CV: Well-perfused. Warm.  Resp: Non-labored.  Neuro: Sensation intact throughout. No gross coordination deficits.  Gait: Nonpathologic posture, unremarkable stride without signs of limp or balance issues. Wrist/Hand, Bilateral: TTP noted throughout without any focal areas of greater severity. Inspection yielded no erythema, ecchymosis, bony deformity, or swelling. ROM full with good flexion and extension and ulnar/radial deviation that is symmetrical. Palpation is normal over metacarpals, scaphoid, lunate, and TFCC; tendons without tenderness/swelling. Strength 5/5 in all directions without pain. Negative Finkelstein, tinel's and phalens. Ankle/Foot, Bilateral: TTP noted without any focal areas of greater severity. No visible erythema, swelling, ecchymosis, or bony deformity. No notable pes planus/cavus deformity.  Transverse arch grossly intact; No evidence of tibiotalar deviation; Range of motion is full in all directions. Strength is 5/5 in all directions. No tenderness at the insertion/body/myotendinous junction of the Achilles tendon; No peroneal tendon tenderness or subluxation; No tenderness on posterior aspects of lateral and medial malleolus; Stable lateral and medial ligaments; Unremarkable squeeze and kleiger tests; Talar dome nontender; Unremarkable calcaneal squeeze; No plantar calcaneal tenderness; No tenderness over the navicular prominence; No tenderness over cuboid; No pain at base of 5th MT; No tenderness at the distal metatarsals; Negative tarsal tunnel tinel's; Able to walk 4 steps.    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  No red flag symptoms on exam.  Strength was intact bilaterally.  Differential varies greatly but nerve impingement/entrapment conditions like carpal tunnel seems less likely as it seems to be affecting both sides equally and would not explain the lower extremity involvement.  Swelling reported by patient could be secondary to her thyroid disorder however most recent labs indicated TSH levels within normal range.  Could be related to vitamin deficiencies.  Psychosomatic causes seem most likely with such a chronic generalized unchanging history. -Initiating amitriptyline 25 mg at bedtime (discussed breastfeeding caution) -Strongly encouraged increasing daily activity with exercise and ambulation >> footwear seen in office was appropriate -Follow-up in 4 weeks  Next: Consideration for initiating vitamin and coenzyme supplements if symptoms respond to amitriptyline but with room for improvement   Meds ordered this encounter  Medications  . amitriptyline (ELAVIL) 25 MG tablet    Sig: Take 1 tablet (  25 mg total) by mouth at bedtime.    Dispense:   30 tablet    Refill:  2     Kathee Delton, MD,MS [facility] Sports Medicine Fellow 02/14/2018 11:09 AM

## 2018-02-14 NOTE — Progress Notes (Signed)
[facility]: Attending Note: I have reviewed the chart, discussed wit the Sports Medicine Fellow. I agree with assessment and treatment plan as detailed in the Fellow's note.  

## 2018-02-14 NOTE — Patient Instructions (Signed)
It was a pleasure seeing you today in our clinic. Today we discussed your hand and foot pain. Here is the treatment plan we have discussed and agreed upon together:   The pain you are currently experiencing is likely the cause of "peripheral neuritis", also called "peripheral neuropathy".  There are many causes of this type of ailment which makes the specific cause of your symptoms difficult to fully understand.  However I feel the best way to address these issues is to increase your daily activity levels, possibly with regular exercise -- long walks, stationary bike, elliptical, and swimming.  The medication I prescribed you today will help reduce the inflammation around these nerves.

## 2018-02-17 LAB — HM PAP SMEAR

## 2018-03-04 ENCOUNTER — Other Ambulatory Visit: Payer: Self-pay | Admitting: Family

## 2018-03-04 DIAGNOSIS — E039 Hypothyroidism, unspecified: Secondary | ICD-10-CM

## 2018-03-07 ENCOUNTER — Ambulatory Visit (INDEPENDENT_AMBULATORY_CARE_PROVIDER_SITE_OTHER): Payer: PPO | Admitting: Family Medicine

## 2018-03-07 ENCOUNTER — Encounter: Payer: Self-pay | Admitting: Family Medicine

## 2018-03-07 DIAGNOSIS — G609 Hereditary and idiopathic neuropathy, unspecified: Secondary | ICD-10-CM | POA: Diagnosis not present

## 2018-03-07 MED ORDER — AMITRIPTYLINE HCL 25 MG PO TABS: 25.0000 mg | ORAL_TABLET | Freq: Every day | ORAL | 3 refills | Status: DC

## 2018-03-07 NOTE — Progress Notes (Signed)
[facility]: Attending Note: I have reviewed the chart, discussed wit the Sports Medicine Fellow. I agree with assessment and treatment plan as detailed in the Fellow's note. Bilateral hand and foot paresthesias 70% improved with medication.  Continue at current dose see her back in 2 or 3 months.  Should symptoms suddenly worsen, she will let us know.  Could consider increasing dose . interpreter was used for entire office visit.

## 2018-03-07 NOTE — Patient Instructions (Signed)
Continue your medicine for another 3 months and then return to clinic. Call or come back if things worsen before then. I am really glad this medicine helped. Great to see you!

## 2018-03-07 NOTE — Assessment & Plan Note (Signed)
Patient is here following up regarding her idiopathic polyneuropathy.  She has had significant improvement with the amitriptyline prescribed at the last visit.  She desires to continue this medication at the current dosing. -Continue amitriptyline -Encouraged daily exercise/increased activity -Ice as needed -Follow-up in 2-3 months

## 2018-03-07 NOTE — Progress Notes (Signed)
   HPI  CC: Follow-up bilateral hand and foot pain Patient is here for follow-up regarding her bilateral hand and foot pain.  At the last visit she was diagnosed with idiopathic polyneuropathy.  She was placed on amitriptyline at night.  Patient states that she feels significantly better.  Overall feels 70-80% better.  She continues to have some occasional achiness in her hands when she is actively using them but this is much more tolerable than it had been in the past.  She denies any side effects from the medication.  Overall she is very pleased with her current progress.  Medications/Interventions Tried: Amitriptyline  See HPI and/or previous note for associated ROS.  Objective: BP 116/70  Gen: NAD, well groomed, a/o x3, normal affect.  CV: Well-perfused. Warm.  Resp: Non-labored.  Neuro: Sensation intact throughout. No gross coordination deficits.  Gait: Nonpathologic posture, unremarkable stride without signs of limp or balance issues. Wrist/Hand, Bilateral: TTP dramatically improved (but still present) throughout. Still without any focal areas of greater severity. Inspection yielded no erythema, ecchymosis, bony deformity, or swelling. ROM full with good flexion and extension and ulnar/radial deviation that is symmetrical. Strength 5/5 in all directions without pain. Negative Finkelstein, tinel's and phalens. Ankle/Foot, Bilateral: TTP dramatically improved (but still present). Still without any focal areas of greater severity. No visible erythema, swelling, ecchymosis, or bony deformity. Range of motion is full in all directions. Strength is 5/5 in all directions. No tenderness at the distal metatarsals; Able to walk 4 steps.    Assessment and plan:  Idiopathic polyneuropathy Patient is here following up regarding her idiopathic polyneuropathy.  She has had significant improvement with the amitriptyline prescribed at the last visit.  She desires to continue this medication at the  current dosing. -Continue amitriptyline -Encouraged daily exercise/increased activity -Ice as needed -Follow-up in 2-3 months   Meds ordered this encounter  Medications  . amitriptyline (ELAVIL) 25 MG tablet    Sig: Take 1 tablet (25 mg total) by mouth at bedtime.    Dispense:  30 tablet    Refill:  3     Kathee DeltonIan D Shaquanta Harkless, MD,MS [facility] Fellow 03/07/2018 1:06 PM

## 2018-03-10 ENCOUNTER — Encounter: Payer: Self-pay | Admitting: Family

## 2018-03-10 ENCOUNTER — Ambulatory Visit (INDEPENDENT_AMBULATORY_CARE_PROVIDER_SITE_OTHER): Payer: PPO | Admitting: Family

## 2018-03-10 VITALS — BP 114/69 | HR 76 | Temp 98.4°F | Resp 16 | Ht 65.0 in | Wt 216.8 lb

## 2018-03-10 DIAGNOSIS — Z0000 Encounter for general adult medical examination without abnormal findings: Secondary | ICD-10-CM

## 2018-03-10 DIAGNOSIS — L659 Nonscarring hair loss, unspecified: Secondary | ICD-10-CM | POA: Diagnosis not present

## 2018-03-10 LAB — HEPATIC FUNCTION PANEL
ALBUMIN: 4.1 g/dL (ref 3.5–5.2)
ALK PHOS: 64 U/L (ref 39–117)
ALT: 17 U/L (ref 0–35)
AST: 19 U/L (ref 0–37)
BILIRUBIN DIRECT: 0 mg/dL (ref 0.0–0.3)
TOTAL PROTEIN: 7.5 g/dL (ref 6.0–8.3)
Total Bilirubin: 0.4 mg/dL (ref 0.2–1.2)

## 2018-03-10 LAB — CBC WITH DIFFERENTIAL/PLATELET
Basophils Absolute: 0 10*3/uL (ref 0.0–0.1)
Basophils Relative: 0.4 % (ref 0.0–3.0)
EOS PCT: 1.5 % (ref 0.0–5.0)
Eosinophils Absolute: 0.1 10*3/uL (ref 0.0–0.7)
HCT: 40.5 % (ref 36.0–46.0)
Hemoglobin: 13.9 g/dL (ref 12.0–15.0)
LYMPHS ABS: 2.1 10*3/uL (ref 0.7–4.0)
Lymphocytes Relative: 38.8 % (ref 12.0–46.0)
MCHC: 34.2 g/dL (ref 30.0–36.0)
MCV: 82.7 fl (ref 78.0–100.0)
Monocytes Absolute: 0.4 10*3/uL (ref 0.1–1.0)
Monocytes Relative: 7.2 % (ref 3.0–12.0)
NEUTROS PCT: 52.1 % (ref 43.0–77.0)
Neutro Abs: 2.8 10*3/uL (ref 1.4–7.7)
Platelets: 257 10*3/uL (ref 150.0–400.0)
RBC: 4.9 Mil/uL (ref 3.87–5.11)
RDW: 13.9 % (ref 11.5–15.5)
WBC: 5.4 10*3/uL (ref 4.0–10.5)

## 2018-03-10 LAB — BASIC METABOLIC PANEL
BUN: 12 mg/dL (ref 6–23)
CALCIUM: 9.4 mg/dL (ref 8.4–10.5)
CO2: 31 mEq/L (ref 19–32)
Chloride: 101 mEq/L (ref 96–112)
Creatinine, Ser: 0.72 mg/dL (ref 0.40–1.20)
GFR: 99.87 mL/min (ref >60.00)
GLUCOSE: 87 mg/dL (ref 70–99)
Potassium: 4.1 mEq/L (ref 3.5–5.1)
Sodium: 137 mEq/L (ref 135–145)

## 2018-03-10 LAB — LIPID PANEL
CHOL/HDL RATIO: 6
Cholesterol: 210 mg/dL — ABNORMAL HIGH (ref 0–200)
HDL: 36.3 mg/dL — ABNORMAL LOW (ref >39.00)
LDL Cholesterol: 151 mg/dL — ABNORMAL HIGH (ref 0–99)
NONHDL: 173.96
TRIGLYCERIDES: 117 mg/dL (ref 0.0–149.0)
VLDL: 23.4 mg/dL (ref 0.0–40.0)

## 2018-03-10 LAB — TSH: TSH: 1.34 u[IU]/mL (ref 0.35–4.50)

## 2018-03-10 LAB — URINALYSIS, ROUTINE W REFLEX MICROSCOPIC
Bilirubin Urine: NEGATIVE
Hgb urine dipstick: NEGATIVE
Ketones, ur: NEGATIVE
LEUKOCYTES UA: NEGATIVE
Nitrite: NEGATIVE
PH: 6.5 (ref 5.0–8.0)
RBC / HPF: NONE SEEN (ref 0–?)
SPECIFIC GRAVITY, URINE: 1.025 (ref 1.000–1.030)
TOTAL PROTEIN, URINE-UPE24: NEGATIVE
URINE GLUCOSE: NEGATIVE
UROBILINOGEN UA: 0.2 (ref 0.0–1.0)

## 2018-03-10 NOTE — Progress Notes (Signed)
Subjective:    Patient ID: Melissa [name], female    DOB: 08/27/1986, 32 y.o.   MRN: [id number]  HPI  Melissa [name] is a 32 yr old female who presents today for a complete physical.  Immunizations: tdap 2015 Diet: diet is healthy Wt Readings from Last 3 Encounters:  03/10/18 216 lb 12.8 oz (98.3 kg)  02/14/18 218 lb (98.9 kg)  02/04/18 220 lb (99.8 kg)  Exercise:not exercising Pap Smear: 02/17/18 (GYN)  She complains about hair loss- see ROS Review of Systems  Constitutional: Negative for unexpected weight change.       Reports some hair loss,  "for a long time."  Seems to be getting worse.  Hair seems dry.  HENT: Positive for rhinorrhea. Negative for hearing loss.   Eyes: Negative for visual disturbance.  Respiratory: Negative for cough.   Cardiovascular: Negative for leg swelling.  Gastrointestinal: Negative for abdominal pain.       Some acid symptoms  Genitourinary: Negative for dysuria and menstrual problem.  Musculoskeletal: Negative for arthralgias.  Skin: Negative for rash.  Neurological: Negative for headaches.  Hematological: Negative for adenopathy.  Psychiatric/Behavioral:       Denies depression/anxiety       Past Medical History:  Diagnosis Date  . Hyperthyroidism      Social History   Socioeconomic History  . Marital status: Married    Spouse name: Not on file  . Number of children: Not on file  . Years of education: Not on file  . Highest education level: Not on file  Occupational History  . Not on file  Social Needs  . Financial resource strain: Not on file  . Food insecurity:    Worry: Not on file    Inability: Not on file  . Transportation needs:    Medical: Not on file    Non-medical: Not on file  Tobacco Use  . Smoking status: Never Smoker  . Smokeless tobacco: Never Used  Substance and Sexual Activity  . Alcohol use: No  . Drug use: No  . Sexual activity: Not on file  Lifestyle  . Physical activity:    Days per week: Not on  file    Minutes per session: Not on file  . Stress: Not on file  Relationships  . Social connections:    Talks on phone: Not on file    Gets together: Not on file    Attends religious service: Not on file    Active member of club or organization: Not on file    Attends meetings of clubs or organizations: Not on file    Relationship status: Not on file  . Intimate partner violence:    Fear of current or ex partner: Not on file    Emotionally abused: Not on file    Physically abused: Not on file    Forced sexual activity: Not on file  Other Topics Concern  . Not on file  Social History Narrative   [place] husband is a Consulting civil engineerstudent here   Programme researcher, broadcasting/film/videoHomemaker   Completed bachelors (physics education)   Son born 2016   Son born 2013   Enjoys video games social media    Past Surgical History:  Procedure Laterality Date  . CESAREAN SECTION    . CESAREAN SECTION N/A 04/29/2015   Procedure: CESAREAN SECTION;  Surgeon: Noland FordyceKelly Fogleman, MD;  Location: [facility];  Service: Obstetrics;  Laterality: N/A;  . EYE SURGERY     Lasik    Family  History  Problem Relation Age of Onset  . Diabetes Mother   . Thyroid disease Mother   . Diabetes Father   . Hypertension Father   . Hyperlipidemia Father     No Known Allergies  Current Outpatient Medications on File Prior to Visit  Medication Sig Dispense Refill  . amitriptyline (ELAVIL) 25 MG tablet Take 1 tablet (25 mg total) by mouth at bedtime. 30 tablet 3  . levothyroxine (SYNTHROID, LEVOTHROID) 125 MCG tablet Take 1 tablet (125 mcg total) by mouth daily. 30 tablet 2   No current facility-administered medications on file prior to visit.     BP 114/69 (BP Location: Left Arm, Cuff Size: Large)   Pulse 76   Temp 98.4 F (36.9 C) (Oral)   Resp 16   Ht 5\' 5"  (1.651 m)   Wt 216 lb 12.8 oz (98.3 kg)   SpO2 96%   BMI 36.08 kg/m    Objective:   Physical Exam  Physical Exam  Constitutional: She is oriented to person, place, and  time. She appears well-developed and well-nourished. No distress.  HENT:  Head: Normocephalic and atraumatic.  Right Ear: Tympanic membrane and ear canal normal.  Left Ear: Tympanic membrane and ear canal normal.  Mouth/Throat: Oropharynx is clear and moist.  Eyes: Pupils are equal, round, and reactive to light. No scleral icterus.  Neck: Normal range of motion. No thyromegaly present.  Cardiovascular: Normal rate and regular rhythm.   No murmur heard. Pulmonary/Chest: Effort normal and breath sounds normal. No respiratory distress. He has no wheezes. She has no rales. She exhibits no tenderness.  Abdominal: Soft. Bowel sounds are normal. She exhibits no distension and no mass. There is no tenderness. There is no rebound and no guarding.  Musculoskeletal: She exhibits no edema.  Lymphadenopathy:    She has no cervical adenopathy.  Neurological: She is alert and oriented to person, place, and time. She has normal patellar reflexes. She exhibits normal muscle tone. Coordination normal.  Skin: Skin is warm and dry. hairline recedes slightly bilateral temples.  Psychiatric: She has a normal mood and affect. Her behavior is normal. Judgment and thought content normal.  Breast/pelvic: deferred      Assessment & Plan:         Assessment & Plan:  Preventative Care- discussed healthy diet, exercise, weight loss.  Obtain routine lab work   Hair loss-  Refer to dermatology, check CBC and tsh.

## 2018-03-10 NOTE — Patient Instructions (Addendum)
You may use tums as needed for acid indigestion.   Please complete lab work prior to leaving. Continue to work on [employer]healthy diet, exercise, weight loss.  You will be contacted about your referral to Dermatology.

## 2018-03-11 ENCOUNTER — Encounter: Payer: Self-pay | Admitting: Family

## 2018-03-12 ENCOUNTER — Other Ambulatory Visit: Payer: Self-pay

## 2018-03-12 MED ORDER — AMITRIPTYLINE HCL 25 MG PO TABS: 25.0000 mg | ORAL_TABLET | Freq: Every day | ORAL | 1 refills | Status: DC

## 2018-03-25 ENCOUNTER — Encounter: Payer: Self-pay | Admitting: Family

## 2018-05-12 ENCOUNTER — Other Ambulatory Visit: Payer: Self-pay | Admitting: Family

## 2018-05-12 DIAGNOSIS — E039 Hypothyroidism, unspecified: Secondary | ICD-10-CM

## 2018-05-12 NOTE — Telephone Encounter (Signed)
Levothyroxine Rx refilled. Last TSH 1.34 on 4/8 post- OV with Sandford Crazemelissa O'sullivan, NP. [facility] need to make follow-up OV or lab visit for TSH.

## 2018-05-13 NOTE — Telephone Encounter (Signed)
Let's bring her back for follow up in October.

## 2018-06-20 ENCOUNTER — Encounter: Payer: Self-pay | Admitting: Family Medicine

## 2018-06-20 ENCOUNTER — Ambulatory Visit (INDEPENDENT_AMBULATORY_CARE_PROVIDER_SITE_OTHER): Payer: PPO | Admitting: Family Medicine

## 2018-06-20 VITALS — BP 110/74 | Ht 64.0 in | Wt 218.0 lb

## 2018-06-20 DIAGNOSIS — G609 Hereditary and idiopathic neuropathy, unspecified: Secondary | ICD-10-CM

## 2018-06-20 MED ORDER — NORTRIPTYLINE HCL 10 MG PO CAPS: 10.0000 mg | ORAL_CAPSULE | Freq: Every day | ORAL | 2 refills | Status: DC

## 2018-06-20 NOTE — Progress Notes (Signed)
  Melissa [name] - 32 y.o. female MRN [id number]  Date of birth: 04/27/1986    SUBJECTIVE:      Chief Complaint:/ HPI:  Patient is a 32 year old female who returns for follow-up for bilateral upper and lower extremity idiopathic polyneuropathy.  At her last visit she reported having about 80% improvement with the amitriptyline 25 mg nightly.  Today however she reports that she has discontinued the amitriptyline due to causing her daytime drowsiness.  She stopped the medication several days ago.  However, prior to her discontinuing the medication she reports that her symptoms have been increasing.  She denies any other medications for treatment.  She denies any new symptoms.  Her peripheral neuropathy involves her feet and up to about mid tibia in her hands to her wrist. She denies any weakness.  She denies any headache or dizziness.   ROS:     See HPI additionally Pertinent review of systems: negative for fever or unusual weight change.   PERTINENT  PMH / PSH FH / / SH:  Past Medical, Surgical, Social, and Family History Reviewed & Updated in the EMR.  Pertinent findings include:  Patient denies history of smoking.  OBJECTIVE: BP 110/74   Ht 5\' 4"  (1.626 m)   Wt 218 lb (98.9 kg)   BMI 37.42 kg/m   Physical Exam:  Vital signs are reviewed.  MSK - upper and lower extremities: Inspection: No obvious bony deformity.  No swelling in the hands or feet.  No overlying skin changes in the hands or feet. Palpation: No tenderness to palpation of the upper lower extremities ROM: Full range of motion of the elbows wrists knees and ankles. Strength: No focal weakness. Neurovascular: Decreased but intact sensation to light touch in the hands and feet bilaterally.  Palpable and symmetric radial and dorsalis pedis pulses.     ASSESSMENT & PLAN:  1.  Idiopathic peripheral neuropathy -patient has yet unexplained peripheral neuropathy and a stocking-glove distribution.  Patient has not tolerated the  amitriptyline 25 mg.  Today we will switch her to nortriptyline 10 mg nightly.  We will have her return back in 3 to 4 weeks for follow-up.  If she is tolerating this medication well needed she will make a determination at the point whether or not we increase the dose to 25 mg.  If she is not tolerating this medication we will consider Neurontin and referral to neurology for EMG/NCS.

## 2018-06-22 NOTE — Progress Notes (Signed)
[facility]: Attending Note: I have reviewed the chart, discussed wit the Sports Medicine Fellow. I agree with assessment and treatment plan as detailed in the Fellow's note.  

## 2018-07-13 ENCOUNTER — Other Ambulatory Visit: Payer: Self-pay | Admitting: Family Medicine

## 2018-07-25 ENCOUNTER — Ambulatory Visit (INDEPENDENT_AMBULATORY_CARE_PROVIDER_SITE_OTHER): Payer: PPO | Admitting: Family Medicine

## 2018-07-25 VITALS — BP 132/88

## 2018-07-25 DIAGNOSIS — G609 Hereditary and idiopathic neuropathy, unspecified: Secondary | ICD-10-CM | POA: Diagnosis not present

## 2018-07-25 MED ORDER — CYCLOBENZAPRINE HCL 10 MG PO TABS: 10.0000 mg | ORAL_TABLET | Freq: Every evening | ORAL | 0 refills | Status: DC | PRN

## 2018-07-25 MED ORDER — NORTRIPTYLINE HCL 10 MG PO CAPS: 10.0000 mg | ORAL_CAPSULE | Freq: Every day | ORAL | 6 refills | Status: DC

## 2018-07-25 MED ORDER — LIDOCAINE 5 % EX PTCH: 1.0000 | MEDICATED_PATCH | CUTANEOUS | 0 refills | Status: DC

## 2018-07-25 NOTE — Assessment & Plan Note (Signed)
  Pain managed well with TCA. Will refill for 6 months, follow up after then. Discussed discontinuing if she becomes pregnant or if she plans to get pregnant. The patient indicates understanding of these issues and agrees with the plan.

## 2018-07-25 NOTE — Progress Notes (Addendum)
[facility] Attending Note: I have seen and examined this patient. I have discussed this patient with the resident and reviewed the assessment and plan as documented above. I agree with the resident's findings and plan.  Good result with nortriptyline.  Discussed use of that in childbearing age and with potential pregnancy.  Recommend she stop prior to pregnancy.  I would see her back in 6 months for regular follow-up and continue this medication.  She has had good relief.  She also has some questions about sensation of hands and feet swelling.  Sounds like this is mild to moderate lasts 1 or 2 days a month.  Blood pressure is normotensive and chart review except for today's elevated diastolic which may be artifact.

## 2018-07-25 NOTE — Progress Notes (Signed)
   Melissa [name] - 32 y.o. female MRN [id number]  Date of birth: 04/24/1986  SUBJECTIVE:  Including CC & ROS.  CC: follow up hands/feet  Overall has improvement. Did well on TCA. Reports it takes care of about 80% of the pain which is manageable for her. The medication will last the day. Does not cause her excessive drowsiness. Ran out of it 3 days ago and has been feeling the pain in her hands which she descrbies as a throbbing pain. Pain also in her feet at night worse with laying on her side with legs together. She also has hand and feet swelling worse at night that goes away during the day. Wondering if she could take the TCA w/ pregnancy. Her and husband considering getting pregnant at some point.    HISTORY: Past Medical, Surgical, Social, and Family History Reviewed & Updated per EMR.   Pertinent Historical Findings include: PMH- hypoythyroidism, obesity  DATA REVIEWED: Labs- ANA negative  PHYSICAL EXAM:  VS: BP:132/88  HR: bpm  TEMP: ( )  RESP:   HT:    WT:   BMI:  PHYSICAL EXAM:  Gen: pleasant 32 year old in NAD Heart: regular rate, +2 radial pulses and DP pulses bilaterally  Lungs: no increased work of breathing Extremities: no edema or cyanosis MSK: no deformities of hands or feet noted. Positive tinel's sign on right, negative on left. Strength 5/5 in upper and lower extremities bilaterally. Neurovascularly in tact. Neuro: no focal deficits   ASSESSMENT & PLAN: See problem based charting & AVS for pt instructions.  Idiopathic polyneuropathy  Pain managed well with TCA. Will refill for 6 months, follow up after then. Discussed discontinuing if she becomes pregnant or if she plans to get pregnant. The patient indicates understanding of these issues and agrees with the plan.    Dolores PattyAngela Byran Bilotti, DO PGY-3, [facility] 07/25/2018 11:38 AM

## 2018-07-27 NOTE — Progress Notes (Signed)
   Subjective:    Patient ID: Melissa [name], female    DOB: 10/13/1986, 32 y.o.   MRN: [id number]  HPI  Melissa [name] is a 32 yr old female who presents today to discuss halitosis.  She reports that she has been noticing this for more than 1 year.  Has been to the dentist and told that everything looks ok from a dental standpoint.  She denies dry mouth.  She notes that the hallitosis.  She does report some gerd symptoms.  Also has some epigastric discomfort "like a heat" in her stomach per husband who helps to translate. Husband notes that pt is usually unaware of the halitosis and he is the one who points it out to her.   Reports that she flosses twice a day and flosses regularly.     She is being treated with pamelor for idiopathic polyneuropathy.  Review of Systems     Objective:   Physical Exam  Constitutional: She appears well-developed and well-nourished.  HENT:  Head: Normocephalic and atraumatic.  Mouth/Throat: Oropharynx is clear and moist. Normal dentition. No oropharyngeal exudate, posterior oropharyngeal edema or posterior oropharyngeal erythema.  Cardiovascular: Normal rate, regular rhythm and normal heart sounds.  No murmur heard. Pulmonary/Chest: Effort normal and breath sounds normal. No respiratory distress. She has no wheezes.  Abdominal: Soft. There is no tenderness.  Psychiatric: She has a normal mood and affect. Her behavior is normal. Judgment and thought content normal.          Assessment & Plan:  Epigastric discomfort- check H pylori, add zantac. If neg h. Pylori and symptoms persist, consider abdominal US next visit.  Hallitosis- ? If gerd is contributing. Will see how she does with zantac. Also recommended that she use an antibacterial mouthwash bid.

## 2018-07-28 ENCOUNTER — Encounter: Payer: Self-pay | Admitting: Family

## 2018-07-28 ENCOUNTER — Ambulatory Visit (INDEPENDENT_AMBULATORY_CARE_PROVIDER_SITE_OTHER): Payer: PPO | Admitting: Family

## 2018-07-28 VITALS — BP 129/70 | HR 59 | Temp 98.5°F | Resp 18 | Wt 221.0 lb

## 2018-07-28 DIAGNOSIS — R196 Halitosis: Secondary | ICD-10-CM | POA: Diagnosis not present

## 2018-07-28 DIAGNOSIS — R1013 Epigastric pain: Secondary | ICD-10-CM

## 2018-07-28 MED ORDER — RANITIDINE HCL 150 MG PO TABS: 150.0000 mg | ORAL_TABLET | Freq: Two times a day (BID) | ORAL | 2 refills | Status: DC

## 2018-07-28 NOTE — Patient Instructions (Addendum)
Please go to the lab prior to leaving. Start zantac twice daily (antacid medication). Continue to brush teeth twice daily and floss daily. Use an antibacterial mouthwash twice daily such as [employer] mouthwash.

## 2018-07-29 LAB — H. PYLORI BREATH TEST: H. PYLORI BREATH TEST: DETECTED — AB

## 2018-07-31 ENCOUNTER — Telehealth: Payer: Self-pay | Admitting: Family

## 2018-07-31 MED ORDER — BIS SUBCIT-METRONID-TETRACYC 140-125-125 MG PO CAPS: 3.0000 | ORAL_CAPSULE | Freq: Three times a day (TID) | ORAL | 0 refills | Status: DC

## 2018-07-31 MED ORDER — OMEPRAZOLE 20 MG PO CPDR: 20.0000 mg | DELAYED_RELEASE_CAPSULE | Freq: Two times a day (BID) | ORAL | 0 refills | Status: DC

## 2018-07-31 NOTE — Telephone Encounter (Signed)
H pylori testing is positive.  I would like to stop zantac and start prilosec bid.  I would also like to treat her with pylera 4x daily for 10 days.

## 2018-08-01 NOTE — Telephone Encounter (Signed)
Attempted to reach pt and left detailed message on voicemail regarding below instructions and to call the office if pt or spouse has questions. [facility] to discuss with pt / spouse if they call back.

## 2018-08-06 ENCOUNTER — Telehealth: Payer: Self-pay | Admitting: *Deleted

## 2018-08-06 NOTE — Telephone Encounter (Signed)
Copied from CRM [id number]. Topic: General - Other >> Aug 05, 2018  9:17 AM Trula Slade wrote: Reason for CRM:   Patient would like a call back to discuss how to use her medication

## 2018-08-06 NOTE — Telephone Encounter (Signed)
Spoke with pt's spouse regarding clarification of when to take pylera and omeprazole. Questions answered per medication directions.

## 2018-08-10 ENCOUNTER — Other Ambulatory Visit: Payer: Self-pay | Admitting: Family

## 2018-08-10 DIAGNOSIS — E039 Hypothyroidism, unspecified: Secondary | ICD-10-CM

## 2018-08-23 ENCOUNTER — Other Ambulatory Visit: Payer: Self-pay | Admitting: Family

## 2018-08-26 ENCOUNTER — Ambulatory Visit (INDEPENDENT_AMBULATORY_CARE_PROVIDER_SITE_OTHER): Payer: PPO | Admitting: Medical

## 2018-08-26 ENCOUNTER — Encounter: Payer: Self-pay | Admitting: Medical

## 2018-08-26 ENCOUNTER — Other Ambulatory Visit: Payer: Self-pay | Admitting: Family

## 2018-08-26 VITALS — BP 123/62 | HR 70 | Temp 98.1°F | Resp 16 | Ht 64.0 in | Wt 221.0 lb

## 2018-08-26 DIAGNOSIS — R51 Headache: Secondary | ICD-10-CM | POA: Diagnosis not present

## 2018-08-26 DIAGNOSIS — R110 Nausea: Secondary | ICD-10-CM | POA: Diagnosis not present

## 2018-08-26 DIAGNOSIS — R519 Headache, unspecified: Secondary | ICD-10-CM

## 2018-08-26 MED ORDER — KETOROLAC TROMETHAMINE 60 MG/2ML IM SOLN
60.0000 mg | Freq: Once | INTRAMUSCULAR | Status: AC
  Administered 2018-08-26: 60 mg via INTRAMUSCULAR

## 2018-08-26 NOTE — Patient Instructions (Addendum)
With your recent symptoms of left-sided headache, light sensitivity, sensitivity, nausea and vomiting, I do think your headache probably represents migraine type headache.  Since this is the first time that you had a full constellation of these type symptoms we will treat with Toradol and follow your response.  If your headache were to worsen despite Toradol then would recommend ED evaluation.  However I do expect that your headache level would diminished to level 1 or 2 or possibly resolved completely.  You could my chart me response to the Toradol later this afternoon.  If you do get near resolution or complete resolution of headache then would probably prescribe Imitrex tablet to take early on in the event of recurrent similar type headache.  I do want you to go ahead and try to make appointment with your PCP in 2 weeks(or as needed) to discuss this recent headache as well as to discuss any potential recurrent headaches during the interim.  Depending on how I how you do we might refer you to neurologist.

## 2018-08-26 NOTE — Progress Notes (Signed)
Subjective:    Patient ID: Melissa [name], female    DOB: 05-Feb-1986, 32 y.o.   MRN: [id number]  HPI  Pt in with some recent ha on left side of her head since this morning.(no injury or fall). Some light sensitvity and sound sensitivity. Some slight blurred vision. Some nausea and vomiting. Pt in th past would get occasional ha in the past but not with other accompanying symptoms.  No known history of migraie ha in her family.   No rash on side of her face.  Pt has contraceptive use(got nexplanon in [place]). She gets  cycles occasional. Last time 3 weeks ago.  Mother of 2 children.  Ha level 5/10. Was worse early in am.   Review of Systems  Constitutional: Negative for chills, fatigue and fever.  Eyes: Positive for photophobia.       Mild intermittent blurred vision. No described severe vision changes or spots in visual field.   Respiratory: Negative for cough, chest tightness, shortness of breath and wheezing.   Cardiovascular: Negative for chest pain and palpitations.  Gastrointestinal: Positive for nausea and vomiting.  Genitourinary: Negative for dysuria and frequency.  Musculoskeletal: Negative for back pain.  Skin: Negative for pallor and rash.  Neurological: Positive for headaches. Negative for dizziness, seizures, syncope, weakness and light-headedness.       Sound sensitive.  Hematological: Negative for adenopathy. Does not bruise/bleed easily.  Psychiatric/Behavioral: Negative for behavioral problems, confusion and hallucinations.   Past Medical History:  Diagnosis Date  . Hyperthyroidism      Social History   Socioeconomic History  . Marital status: Married    Spouse name: Not on file  . Number of children: Not on file  . Years of education: Not on file  . Highest education level: Not on file  Occupational History  . Not on file  Social Needs  . Financial resource strain: Not on file  . Food insecurity:    Worry: Not on file    Inability: Not on file  .  Transportation needs:    Medical: Not on file    Non-medical: Not on file  Tobacco Use  . Smoking status: Never Smoker  . Smokeless tobacco: Never Used  Substance and Sexual Activity  . Alcohol use: No  . Drug use: No  . Sexual activity: Not on file  Lifestyle  . Physical activity:    Days per week: Not on file    Minutes per session: Not on file  . Stress: Not on file  Relationships  . Social connections:    Talks on phone: Not on file    Gets together: Not on file    Attends religious service: Not on file    Active member of club or organization: Not on file    Attends meetings of clubs or organizations: Not on file    Relationship status: Not on file  . Intimate partner violence:    Fear of current or ex partner: Not on file    Emotionally abused: Not on file    Physically abused: Not on file    Forced sexual activity: Not on file  Other Topics Concern  . Not on file  Social History Narrative   [place]- originally- husband is a Consulting civil engineer here   Programme researcher, broadcasting/film/video (physics education)   Son born 2016   Son born 2013   Enjoys video games social media    Past Surgical History:  Procedure Laterality Date  . CESAREAN  SECTION    . CESAREAN SECTION N/A 04/29/2015   Procedure: CESAREAN SECTION;  Surgeon: Noland FordyceKelly Fogleman, MD;  Location: [facility];  Service: Obstetrics;  Laterality: N/A;  . EYE SURGERY     Lasik    Family History  Problem Relation Age of Onset  . Diabetes Mother   . Thyroid disease Mother   . Diabetes Father   . Hypertension Father   . Hyperlipidemia Father     No Known Allergies  Current Outpatient Medications on File Prior to Visit  Medication Sig Dispense Refill  . levothyroxine (SYNTHROID, LEVOTHROID) 125 MCG tablet TAKE 1 TABLET BY MOUTH EVERY DAY 30 tablet 5  . nortriptyline (PAMELOR) 10 MG capsule Take 1 capsule (10 mg total) by mouth at bedtime. 30 capsule 6  . omeprazole (PRILOSEC) 20 MG capsule TAKE 1 CAPSULE (20 MG TOTAL)  BY MOUTH 2 (TWO) TIMES DAILY BEFORE A MEAL. 60 capsule 6   No current facility-administered medications on file prior to visit.     BP 123/62   Pulse 70   Temp 98.1 F (36.7 C) (Oral)   Resp 16   Ht 5\' 4"  (1.626 m)   Wt 221 lb (100.2 kg)   SpO2 100%   BMI 37.93 kg/m       Objective:   Physical Exam    General Mental Status- Alert. General Appearance- Not in acute distress.   Skin General: Color- Normal Color. Moisture- Normal Moisture.  Neck Carotid Arteries- Normal color. Moisture- Normal Moisture. No carotid bruits. No JVD.  No trapezius tightness to palpation.  Chest and Lung Exam Auscultation: Breath Sounds:-Normal.  Cardiovascular Auscultation:Rythm- Regular. Murmurs & Other Heart Sounds:Auscultation of the heart reveals- No Murmurs.  Abdomen Inspection:-Inspeection Normal. Palpation/Percussion:Note:No mass. Palpation and Percussion of the abdomen reveal- Non Tender, Non Distended + BS, no rebound or guarding.    Neurologic Cranial Nerve exam:- CN III-XII intact(No nystagmus), symmetric smile(patient was wearing a face covering today.)  I did not visualize actual symmetric smile but did palpate over her cheeks and appeared that she did have a symmetric smile with palpation/rise of cheeks. Drift Test:- No drift. Romberg Exam:- Negative.  Heal to Toe Gait exam:-Normal. Finger to Nose:- Normal/Intact Strength:- 5/5 equal and symmetric strength both upper and lower extremities.     Assessment & Plan:  With your recent symptoms of left-sided headache, light sensitivity, sensitivity, nausea and vomiting, I do think your headache probably represents migraine type headache.  Since this is the first time that you had a full constellation of these type symptoms we will treat with Toradol and follow your response.  If your headache were to worsen despite Toradol then would recommend ED evaluation.  However I do expect that your headache level would diminished to  level 1 or 2 or possibly resolved completely.  You could my chart me response to the Toradol later this afternoon.  If you do get near resolution or complete resolution of headache then would probably prescribe Imitrex tablet to take early on in the event of recurrent similar type headache.  I do want you to go ahead and try to make appointment with your PCP in 2 weeks to discuss this recent headache as well as to discuss any potential recurrent headaches during the interim.  Depending on how I how you do we might refer you to neurologist.  Pregnancy test was done to confirm negative prior to Toradol injection.  Patient and her husband indicate that in October current Nexplanon effectiveness will stop  and they will need to address contraceptive method in the near future.  They were not sure when Nexplanon was placed during the month of October.  Ask husband of pt who translated my chart me early afternoon for update.Marland KitchenHe states he would.

## 2018-09-29 ENCOUNTER — Ambulatory Visit: Payer: PPO | Admitting: Family

## 2018-12-12 ENCOUNTER — Other Ambulatory Visit: Payer: Self-pay | Admitting: Family

## 2018-12-12 DIAGNOSIS — E039 Hypothyroidism, unspecified: Secondary | ICD-10-CM

## 2019-03-04 ENCOUNTER — Telehealth: Payer: Self-pay | Admitting: Family

## 2019-03-04 NOTE — Telephone Encounter (Signed)
Attempted to contact spouse via number listed on file and call was disconnected / declined. Sent [employer].

## 2019-03-04 NOTE — Telephone Encounter (Signed)
Please contact the patient's husband who speaks English and let him know that patient is due for follow-up for her thyroid.  Please offer to schedule a virtual visit.

## 2019-03-12 ENCOUNTER — Other Ambulatory Visit: Payer: Self-pay | Admitting: Family Medicine

## 2019-03-19 NOTE — Telephone Encounter (Signed)
Received message that mychart message has not been read. Left detailed message to call the office to schedule a Virtual Visit and we could give more detail at that time.

## 2019-03-25 ENCOUNTER — Other Ambulatory Visit: Payer: Self-pay | Admitting: Family

## 2019-03-26 MED ORDER — NORTRIPTYLINE HCL 10 MG PO CAPS: ORAL_CAPSULE | ORAL | 0 refills | Status: DC

## 2019-03-26 NOTE — Addendum Note (Signed)
Addended by: Sandford Craze on: 03/26/2019 12:46 PM   Modules accepted: Orders

## 2019-03-29 ENCOUNTER — Encounter: Payer: Self-pay | Admitting: Family

## 2019-03-29 ENCOUNTER — Other Ambulatory Visit: Payer: Self-pay | Admitting: Family

## 2019-03-29 DIAGNOSIS — E039 Hypothyroidism, unspecified: Secondary | ICD-10-CM

## 2019-03-30 MED ORDER — LEVOTHYROXINE SODIUM 125 MCG PO TABS: 125.0000 ug | ORAL_TABLET | Freq: Every day | ORAL | 0 refills | Status: DC

## 2019-04-17 ENCOUNTER — Other Ambulatory Visit: Payer: Self-pay | Admitting: Family

## 2019-04-21 ENCOUNTER — Other Ambulatory Visit: Payer: Self-pay | Admitting: Family

## 2019-04-21 DIAGNOSIS — E039 Hypothyroidism, unspecified: Secondary | ICD-10-CM

## 2019-04-21 NOTE — Telephone Encounter (Signed)
Rx refused. Patient needs OV. Please contact pt's husband to schedule.

## 2019-04-22 NOTE — Telephone Encounter (Signed)
Patient scheduled for Friday at 1:40

## 2019-04-24 ENCOUNTER — Ambulatory Visit (INDEPENDENT_AMBULATORY_CARE_PROVIDER_SITE_OTHER): Payer: PPO | Admitting: Family

## 2019-04-24 ENCOUNTER — Other Ambulatory Visit: Payer: Self-pay | Admitting: Family

## 2019-04-24 ENCOUNTER — Ambulatory Visit (HOSPITAL_BASED_OUTPATIENT_CLINIC_OR_DEPARTMENT_OTHER)
Admission: RE | Admit: 2019-04-24 | Discharge: 2019-04-24 | Disposition: A | Discharge status: Home / Self Care | Payer: PPO | Source: Ambulatory Visit | Attending: Family | Admitting: Family

## 2019-04-24 ENCOUNTER — Other Ambulatory Visit: Payer: Self-pay

## 2019-04-24 VITALS — HR 69

## 2019-04-24 DIAGNOSIS — R0602 Shortness of breath: Secondary | ICD-10-CM

## 2019-04-24 DIAGNOSIS — M79605 Pain in left leg: Secondary | ICD-10-CM

## 2019-04-24 DIAGNOSIS — E039 Hypothyroidism, unspecified: Secondary | ICD-10-CM

## 2019-04-24 DIAGNOSIS — M79604 Pain in right leg: Secondary | ICD-10-CM | POA: Diagnosis not present

## 2019-04-24 DIAGNOSIS — Z3490 Encounter for supervision of normal pregnancy, unspecified, unspecified trimester: Secondary | ICD-10-CM

## 2019-04-24 MED ORDER — LEVOTHYROXINE SODIUM 125 MCG PO TABS: 125.0000 ug | ORAL_TABLET | Freq: Every day | ORAL | 0 refills | Status: DC

## 2019-04-24 NOTE — Progress Notes (Signed)
Virtual Visit via Video Note  I connected with Melissa [name] on 04/24/19 at  1:40 PM EDT by a video enabled telemedicine application and verified that I am speaking with the correct person using two identifiers. This visit type was conducted due to national recommendations for restrictions regarding the COVID-19 Pandemic (e.g. social distancing).  This format is felt to be most appropriate for this patient at this time.   I discussed the limitations of evaluation and management by telemedicine and the availability of in person appointments. The patient expressed understanding and agreed to proceed.  The patient myself and her husband who assists with translation were on today's video visit. The patient was at home and I was in my office at the time of today's visit.  Her  History of Present Illness:  Pregnancy- She has an initial OB visit on 5/26 per husband. Patient is having some nausea.  Reports that she developed some SOB last Sunday. Husband reports that she is sob with exertion. Notes some mild intermittent swelling in her legs.  Reports that bilateral LE pain.  + pain in the bilateral legs from feet up to knees.  Hurts more if she walks  Hypothyroid- she continues synthroid.  Past Medical History:  Diagnosis Date  . Hyperthyroidism      Social History   Socioeconomic History  . Marital status: Married    Spouse name: Not on file  . Number of children: Not on file  . Years of education: Not on file  . Highest education level: Not on file  Occupational History  . Not on file  Social Needs  . Financial resource strain: Not on file  . Food insecurity:    Worry: Not on file    Inability: Not on file  . Transportation needs:    Medical: Not on file    Non-medical: Not on file  Tobacco Use  . Smoking status: Never Smoker  . Smokeless tobacco: Never Used  Substance and Sexual Activity  . Alcohol use: No  . Drug use: No  . Sexual activity: Not on file  Lifestyle  .  Physical activity:    Days per week: Not on file    Minutes per session: Not on file  . Stress: Not on file  Relationships  . Social connections:    Talks on phone: Not on file    Gets together: Not on file    Attends religious service: Not on file    Active member of club or organization: Not on file    Attends meetings of clubs or organizations: Not on file    Relationship status: Not on file  . Intimate partner violence:    Fear of current or ex partner: Not on file    Emotionally abused: Not on file    Physically abused: Not on file    Forced sexual activity: Not on file  Other Topics Concern  . Not on file  Social History Narrative   [place]- originally- husband is a Consulting civil engineer here   Programme researcher, broadcasting/film/video (physics education)   Son born 2016   Son born 2013   Enjoys video games social media    Past Surgical History:  Procedure Laterality Date  . CESAREAN SECTION    . CESAREAN SECTION N/A 04/29/2015   Procedure: CESAREAN SECTION;  Surgeon: Noland Fordyce, MD;  Location: [facility];  Service: Obstetrics;  Laterality: N/A;  . EYE SURGERY     Lasik    Family History  Problem Relation Age of Onset  . Diabetes Mother   . Thyroid disease Mother   . Diabetes Father   . Hypertension Father   . Hyperlipidemia Father     No Known Allergies  Current Outpatient Medications on File Prior to Visit  Medication Sig Dispense Refill  . levothyroxine (SYNTHROID) 125 MCG tablet Take 1 tablet (125 mcg total) by mouth daily. 30 tablet 0   No current facility-administered medications on file prior to visit.     Pulse 69   SpO2 99%    Observations/Objective:   Gen: Awake, alert, no acute distress Resp: Breathing is even and non-labored CV: s1/s2, rrr, no murmur noted Psych: calm/pleasant demeanor Neuro: Alert and Oriented x 3, + facial symmetry, speech is clear. Ext: 1-2+ bilateral LE edema.   Assessment and Plan:  SOB- could be related to pregnancy, but  given her LE pain and swelling I would like to evaluate her for DVT.  Will obtain bilateral LE doppler to rule out DVT.  Hypothyroid- obtain follow up TSH  Pregnancy- has an appointment next week to establish with OB/GYN for initial OB visit.  This visit was initially conducted via video but I did examine the patient while she was in the lab today.  Follow Up Instructions:    I discussed the assessment and treatment plan with the patient. The patient was provided an opportunity to ask questions and all were answered. The patient agreed with the plan and demonstrated an understanding of the instructions.   The patient was advised to call back or seek an in-person evaluation if the symptoms worsen or if the condition fails to improve as anticipated.    Lemont FillersMelissa S O'Sullivan, NP

## 2019-04-25 LAB — TSH: TSH: 3.32 mIU/L

## 2019-04-26 ENCOUNTER — Other Ambulatory Visit: Payer: Self-pay | Admitting: Family

## 2019-04-26 DIAGNOSIS — E039 Hypothyroidism, unspecified: Secondary | ICD-10-CM

## 2019-04-28 ENCOUNTER — Encounter: Payer: Self-pay | Admitting: Family

## 2019-04-28 DIAGNOSIS — E039 Hypothyroidism, unspecified: Secondary | ICD-10-CM

## 2019-04-28 MED ORDER — LEVOTHYROXINE SODIUM 125 MCG PO TABS: 125.0000 ug | ORAL_TABLET | Freq: Every day | ORAL | 1 refills | Status: DC

## 2019-05-25 ENCOUNTER — Ambulatory Visit (HOSPITAL_COMMUNITY): Payer: PPO

## 2019-05-27 ENCOUNTER — Encounter (HOSPITAL_COMMUNITY): Payer: Self-pay

## 2019-05-27 ENCOUNTER — Ambulatory Visit (HOSPITAL_COMMUNITY): Payer: PPO

## 2019-06-06 ENCOUNTER — Other Ambulatory Visit: Payer: Self-pay

## 2019-06-06 ENCOUNTER — Inpatient Hospital Stay (HOSPITAL_COMMUNITY): Payer: PPO

## 2019-06-06 ENCOUNTER — Inpatient Hospital Stay (HOSPITAL_COMMUNITY)
Admission: AD | Admit: 2019-06-06 | Discharge: 2019-06-07 | Disposition: A | Discharge status: Home / Self Care | Payer: PPO | Attending: Obstetrics | Admitting: Obstetrics

## 2019-06-06 ENCOUNTER — Encounter (HOSPITAL_COMMUNITY): Payer: Self-pay

## 2019-06-06 DIAGNOSIS — O034 Incomplete spontaneous abortion without complication: Secondary | ICD-10-CM | POA: Diagnosis not present

## 2019-06-06 DIAGNOSIS — O209 Hemorrhage in early pregnancy, unspecified: Secondary | ICD-10-CM

## 2019-06-06 DIAGNOSIS — Z3A11 11 weeks gestation of pregnancy: Secondary | ICD-10-CM | POA: Insufficient documentation

## 2019-06-06 HISTORY — DX: Hypothyroidism, unspecified: E03.9

## 2019-06-06 LAB — CBC
HCT: 35.5 % — ABNORMAL LOW (ref 36.0–46.0)
Hemoglobin: 12 g/dL (ref 12.0–15.0)
MCH: 28.6 pg (ref 26.0–34.0)
MCHC: 33.8 g/dL (ref 30.0–36.0)
MCV: 84.7 fL (ref 80.0–100.0)
Platelets: 228 10*3/uL (ref 150–400)
RBC: 4.19 MIL/uL (ref 3.87–5.11)
RDW: 13.5 % (ref 11.5–15.5)
WBC: 12 10*3/uL — ABNORMAL HIGH (ref 4.0–10.5)
nRBC: 0 % (ref 0.0–0.2)

## 2019-06-06 MED ORDER — PROMETHAZINE HCL 25 MG PO TABS: 12.5000 mg | ORAL_TABLET | Freq: Four times a day (QID) | ORAL | 0 refills | Status: DC | PRN

## 2019-06-06 MED ORDER — MISOPROSTOL 200 MCG PO TABS: 600.0000 ug | ORAL_TABLET | Freq: Once | ORAL | 0 refills | Status: DC

## 2019-06-06 MED ORDER — OXYCODONE-ACETAMINOPHEN 5-325 MG PO TABS: 2.0000 | ORAL_TABLET | Freq: Four times a day (QID) | ORAL | 0 refills | Status: DC | PRN

## 2019-06-06 NOTE — MAU Note (Addendum)
Pt was at [facility] on Thursday for miscarriage. LMP was 04/02/19. Pt was seen in June and pregnancy was okay per patient.    Was going to see doctor on Tuesday "to be cleaned out."  Yesterday, she began having bleeding, nausea, dizziness and severe pain like labor. Changing pad every half hour with clots.

## 2019-06-06 NOTE — MAU Provider Note (Signed)
Chief Complaint: Miscarriage and Vaginal Bleeding   First Provider Initiated Contact with Patient 06/06/19 2210      SUBJECTIVE HPI: Melissa [name] is a 33 y.o. G3P2001 at 11 weeks by LMP but with diagnosed SAB at 7 weeks by [place] this week who presents to maternity admissions reporting vaginal bleeding x 3 days with onset of heavier bleeding with clots today. She reports changing her pad every 30 minutes but pads were not saturated.  The bleeding is associated with cramping pain. She took pain medication as prescribed by her Ob/gyn and it helped but did not take the pain away.  She denies any dizziness or h/a.  There are no other symptoms. She has not tried other treatments.  Initially used arabic interpreter on video language line but pt signed consent with interpreter to use her husband for all translation.   HPI  Past Medical History:  Diagnosis Date  . Hypothyroidism    Past Surgical History:  Procedure Laterality Date  . CESAREAN SECTION    . CESAREAN SECTION N/A 04/29/2015   Procedure: CESAREAN SECTION;  Surgeon: Aloha Gell, MD;  Location: [facility];  Service: Obstetrics;  Laterality: N/A;  . EYE SURGERY     Lasik   Social History   Socioeconomic History  . Marital status: Married    Spouse name: Not on file  . Number of children: Not on file  . Years of education: Not on file  . Highest education level: Not on file  Occupational History  . Not on file  Social Needs  . Financial resource strain: Not on file  . Food insecurity    Worry: Not on file    Inability: Not on file  . Transportation needs    Medical: Not on file    Non-medical: Not on file  Tobacco Use  . Smoking status: Never Smoker  . Smokeless tobacco: Never Used  Substance and Sexual Activity  . Alcohol use: No  . Drug use: No  . Sexual activity: Not on file  Lifestyle  . Physical activity    Days per week: Not on file    Minutes per session: Not on file  . Stress: Not on file  Relationships  .  Social Herbalist on phone: Not on file    Gets together: Not on file    Attends religious service: Not on file    Active member of club or organization: Not on file    Attends meetings of clubs or organizations: Not on file    Relationship status: Not on file  . Intimate partner violence    Fear of current or ex partner: Not on file    Emotionally abused: Not on file    Physically abused: Not on file    Forced sexual activity: Not on file  Other Topics Concern  . 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    Current Outpatient Medications on File Prior to Encounter  Medication Sig Dispense Refill  . levothyroxine (SYNTHROID) 125 MCG tablet Take 1 tablet (125 mcg total) by mouth daily. 90 tablet 1   No Known Allergies  ROS:  Review of Systems  Constitutional: Negative for chills, fatigue and fever.  Respiratory:  Negative for shortness of breath.   Cardiovascular: Negative for chest pain.  Gastrointestinal: Positive for abdominal pain. Negative for nausea and vomiting.  Genitourinary: Positive for pelvic pain and vaginal bleeding. Negative for difficulty urinating, dysuria, flank pain, vaginal discharge and vaginal pain.  Neurological: Negative for dizziness and headaches.  Psychiatric/Behavioral: Negative.      I have reviewed patient's Past Medical Hx, Surgical Hx, Family Hx, Social Hx, medications and allergies.   Physical Exam   Patient Vitals for the past 24 hrs:  BP Temp Temp src Pulse Resp SpO2 Height Weight  06/06/19 2349 (!) 126/59 - - - - - - -  06/06/19 2233 132/71 - - 70 - - - -  06/06/19 2136 - 98.7 F (37.1 C) Oral - - - 5' 4.57" (1.64 m) 103 kg  06/06/19 2134 (!) 103/48 - Oral 79 18 99 % - -    Constitutional: Well-developed, well-nourished female in no acute distress.  Cardiovascular: normal rate Respiratory: normal effort GI: Abd soft, non-tender. Pos BS x 4 MS: Extremities nontender, no edema, normal ROM Neurologic: Alert and oriented x 4.  GU: Neg CVAT.  PELVIC EXAM: Cervix pink, visually closed, without lesion, moderate amount of dark red bleeding with small clots, requiring 2 fox swabs to visualize cervix, vaginal walls and external genitalia normal Bimanual exam: Cervix 0/long/high, firm, anterior, neg CMT, uterus mildly tender, nonenlarged, adnexa without tenderness, enlargement, or mass    LAB RESULTS Results for orders placed or performed during the hospital encounter of 06/06/19 (from the past 24 hour(s))  CBC     Status: Abnormal   Collection Time: 06/06/19 10:09 PM  Result Value Ref Range   WBC 12.0 (H) 4.0 - 10.5 K/uL   RBC 4.19 3.87 - 5.11 MIL/uL   Hemoglobin 12.0 12.0 - 15.0 g/dL   HCT 16.135.5 (L) 09.636.0 - 04.546.0 %   MCV 84.7 80.0 - 100.0 fL   MCH 28.6 26.0 - 34.0 pg   MCHC 33.8 30.0 - 36.0 g/dL   RDW 40.913.5 81.111.5 - 91.415.5 %   Platelets 228 150 - 400 K/uL   nRBC 0.0 0.0 - 0.2 %       IMAGING [place] Ob Comp Less 14 Wks  Result Date: 06/06/2019 CLINICAL DATA:  33 year old female with vaginal bleeding in the 1st trimester of pregnancy. EXAM: OBSTETRIC <14 WK US TECHNIQUE: Transabdominal ultrasound examination were performed for complete evaluation of the gestation as well as the maternal uterus, adnexal regions, and pelvic cul-de-sac. COMPARISON:  Pelvis ultrasound 06/30/2015. FINDINGS: Intrauterine gestational sac: None Maternal uterus/adnexae: Thin and bland appearing upper endometrium (image 8). In the lower uterine segment there is a heterogeneous area of echogenicity encompassing 17 x 21 millimeters in the lower myometrium (images 32 and 33) which is not hypervascular. No pelvic free fluid. The left ovary is normal measuring 1.7 x 1.6 x 1.5 centimeters. The right  ovary appears to contain the corpus luteum (images 26 and 27), and measures 2.5 x 2.7 x 2.8 centimeters. IMPRESSION: 1. No normal IUP identified. There is an unusual heterogeneous 21 mm area in the lower myometrium (image 32), but this appears to lack vascularity on color Doppler. Both ovaries appear normal, corpus luteum suspected on the right. 2. The differential consideration includes early IUP, failed IUP, and ectopic pregnancy. Recommend serial quantitative beta HCG and repeat imaging as necessary. Electronically Signed   By: Odessa FlemingH  Hall M.D.   On: 06/06/2019 23:04    MAU Management/MDM: Orders Placed This Encounter  Procedures  .  US OB Comp Less 14 Wks  . CBC  . Discharge patient    Meds ordered this encounter  Medications  . misoprostol (CYTOTEC) 200 MCG tablet    Sig: Take 3 tablets (600 mcg total) by mouth once for 1 dose. Place 3 tablets into your cheek and let them dissolve to a paste before swallowing.    Dispense:  2 tablet    Refill:  0    Order Specific Question:   Supervising Provider    Answer:   Reva BoresPRATT, TANYA S [[id number]]  . oxyCODONE-acetaminophen (PERCOCET/ROXICET) 5-325 MG tablet    Sig: Take 2 tablets by mouth every 6 (six) hours as needed for severe pain.    Dispense:  6 tablet    Refill:  0    Order Specific Question:   Supervising Provider    Answer:   Reva BoresPRATT, TANYA S [[id number]]  . promethazine (PHENERGAN) 25 MG tablet    Sig: Take 0.5-1 tablets (12.5-25 mg total) by mouth every 6 (six) hours as needed for nausea.    Dispense:  30 tablet    Refill:  0    Order Specific Question:   Supervising Provider    Answer:   Reva BoresPRATT, TANYA S [[id number]]    US today without visible IUP but with likely POCs vs large clot in lower uterine segment.  Pt stable with Hgb 12.0 today and no s/sx of anemia.  Discussed options with pt including expectant management, [facility] as [facility], or Cytotec as a prescription to try at home.  Pt and her husband decide to use Cytotec.  Rx  for Cytotec 600 mcg buccal dose x 1, Phenergan 12.5-25 mg Q 6 hours PRN, and Percocet 5/325, take 2 Q 6 hours PRN x 6 tablets only.  [facility]  Return to MAU with heavy bleeding or pain not well managed with medication.   Pt discharged with strict bleeding precautions.  ASSESSMENT 1. Incomplete spontaneous abortion with no complication   2. Vaginal bleeding in pregnancy, first trimester     PLAN Discharge home Allergies as of 06/06/2019   No Known Allergies     Medication List    TAKE these medications   levothyroxine 125 MCG tablet Commonly known as: SYNTHROID Take 1 tablet (125 mcg total) by mouth daily.   misoprostol 200 MCG tablet Commonly known as: CYTOTEC Take 3 tablets (600 mcg total) by mouth once for 1 dose. Place 3 tablets into your cheek and let them dissolve to a paste before swallowing.   oxyCODONE-acetaminophen 5-325 MG tablet Commonly known as: PERCOCET/ROXICET Take 2 tablets by mouth every 6 (six) hours as needed for severe pain.   promethazine 25 MG tablet Commonly known as: PHENERGAN Take 0.5-1 tablets (12.5-25 mg total) by mouth every 6 (six) hours as needed for nausea.      Follow-up Information    [facility] Follow up.   Why: Return to MAU as needed for heavy bleeding or severe pain. Contact information: [address] [place] [id number] [phone]           Sharen CounterLisa Leftwich-Kirby Certified Nurse-Midwife 06/06/2019  11:51 PM

## 2019-07-15 ENCOUNTER — Telehealth: Payer: Self-pay

## 2019-07-15 NOTE — Telephone Encounter (Signed)
Called number listed but no answer, lvm for patient's husband to call for appointment.

## 2019-07-15 NOTE — Telephone Encounter (Signed)
Copied from [place] [phone]. Topic: Appointment Scheduling - Scheduling Inquiry for Clinic >> Jul 14, 2019  4:44 PM Lillie Fragmin, Krista Blue wrote: Reason for CRM: Pt's husband called stating that he wanted to know if his wife can schedule an appt due to her having a tingling sensation in some of the fingers on her left hand. Pt has had that sensation for about 2 days. Please call to schedule appt.

## 2019-08-25 ENCOUNTER — Encounter: Payer: Self-pay | Admitting: Family Medicine

## 2019-08-25 ENCOUNTER — Ambulatory Visit (HOSPITAL_BASED_OUTPATIENT_CLINIC_OR_DEPARTMENT_OTHER)
Admission: RE | Admit: 2019-08-25 | Discharge: 2019-08-25 | Disposition: A | Discharge status: Home / Self Care | Payer: PPO | Source: Ambulatory Visit | Attending: Family Medicine | Admitting: Family Medicine

## 2019-08-25 ENCOUNTER — Ambulatory Visit (INDEPENDENT_AMBULATORY_CARE_PROVIDER_SITE_OTHER): Payer: PPO | Admitting: Family Medicine

## 2019-08-25 ENCOUNTER — Other Ambulatory Visit: Payer: Self-pay

## 2019-08-25 ENCOUNTER — Encounter (HOSPITAL_BASED_OUTPATIENT_CLINIC_OR_DEPARTMENT_OTHER): Payer: Self-pay

## 2019-08-25 VITALS — BP 110/78 | HR 93 | Temp 97.4°F | Resp 18 | Ht 64.7 in | Wt 230.2 lb

## 2019-08-25 DIAGNOSIS — M5441 Lumbago with sciatica, right side: Secondary | ICD-10-CM | POA: Diagnosis not present

## 2019-08-25 DIAGNOSIS — M5442 Lumbago with sciatica, left side: Secondary | ICD-10-CM

## 2019-08-25 DIAGNOSIS — R5383 Other fatigue: Secondary | ICD-10-CM | POA: Diagnosis not present

## 2019-08-25 DIAGNOSIS — E538 Deficiency of other specified B group vitamins: Secondary | ICD-10-CM

## 2019-08-25 MED ORDER — MELOXICAM 15 MG PO TABS: 15.0000 mg | ORAL_TABLET | Freq: Every day | ORAL | 0 refills | Status: DC

## 2019-08-25 MED ORDER — METHOCARBAMOL 500 MG PO TABS: 500.0000 mg | ORAL_TABLET | Freq: Four times a day (QID) | ORAL | 1 refills | Status: DC

## 2019-08-25 NOTE — Progress Notes (Signed)
Patient ID: Melissa [name], female    DOB: [birth year]  Age: 33 y.o. MRN: [id number]    Subjective:  Subjective  HPI Melissa [name] presents for c/o back pain, numbness and pain in toes and fatigue and light headedness.   No known injury  Pain is across low back and numbness and pain in toes.  No pain going down the legs Pt has a hx b12 deficiency and has not been taking b12.  She has been feeling fatigued and light headed and wonders if it could be b12.   Her husband is translating  No chest pain , palpitations or sob   No congestion, ear pain, hearing loss or visual changes   Review of Systems  Constitutional: Positive for fatigue. Negative for appetite change, diaphoresis and unexpected weight change.  Eyes: Negative for pain, redness and visual disturbance.  Respiratory: Negative for cough, chest tightness, shortness of breath and wheezing.   Cardiovascular: Negative for chest pain, palpitations and leg swelling.  Endocrine: Negative for cold intolerance, heat intolerance, polydipsia, polyphagia and polyuria.  Genitourinary: Negative for difficulty urinating, dysuria and frequency.  Musculoskeletal: Positive for back pain. Negative for gait problem and joint swelling.  Neurological: Positive for dizziness and numbness. Negative for light-headedness and headaches.    History Past Medical History:  Diagnosis Date  . Hypothyroidism     She has a past surgical history that includes Eye surgery; Cesarean section; and Cesarean section (N/A, 04/29/2015).   Her family history includes Diabetes in her father and mother; Hyperlipidemia in her father; Hypertension in her father; Thyroid disease in her mother.She reports that she has never smoked. She has never used smokeless tobacco. She reports that she does not drink alcohol or use drugs.  Current Outpatient Medications on File Prior to Visit  Medication Sig Dispense Refill  . levothyroxine (SYNTHROID) 125 MCG tablet Take 1 tablet (125 mcg  total) by mouth daily. 90 tablet 1  . misoprostol (CYTOTEC) 200 MCG tablet Take 3 tablets (600 mcg total) by mouth once for 1 dose. Place 3 tablets into your cheek and let them dissolve to a paste before swallowing. 2 tablet 0  . oxyCODONE-acetaminophen (PERCOCET/ROXICET) 5-325 MG tablet Take 2 tablets by mouth every 6 (six) hours as needed for severe pain. (Patient not taking: Reported on 08/25/2019) 6 tablet 0  . promethazine (PHENERGAN) 25 MG tablet Take 0.5-1 tablets (12.5-25 mg total) by mouth every 6 (six) hours as needed for nausea. (Patient not taking: Reported on 08/25/2019) 30 tablet 0   No current facility-administered medications on file prior to visit.      Objective:  Objective  Physical Exam Vitals signs and nursing note reviewed.  Constitutional:      Appearance: She is well-developed.  HENT:     Head: Normocephalic and atraumatic.  Eyes:     Conjunctiva/sclera: Conjunctivae normal.  Neck:     Musculoskeletal: Normal range of motion and neck supple.     Thyroid: No thyromegaly.     Vascular: No carotid bruit or JVD.  Cardiovascular:     Rate and Rhythm: Normal rate and regular rhythm.     Heart sounds: Normal heart sounds. No murmur.  Pulmonary:     Effort: Pulmonary effort is normal. No respiratory distress.     Breath sounds: Normal breath sounds. No wheezing or rales.  Chest:     Chest wall: No tenderness.  Musculoskeletal:        General: Tenderness present.     Lumbar back: She  exhibits tenderness, pain and spasm. She exhibits normal range of motion, no edema, no deformity and no laceration.     Right lower leg: No edema.     Left lower leg: No edema.  Neurological:     Mental Status: She is alert and oriented to person, place, and time.     Motor: Motor function is intact. No weakness.     Coordination: Coordination is intact.     Gait: Gait is intact.     Deep Tendon Reflexes: Reflexes are normal and symmetric.    BP 110/78 (BP Location: Right Arm,  Patient Position: Sitting, Cuff Size: Large)   Pulse 93   Temp (!) 97.4 F (36.3 C) (Temporal)   Resp 18   Ht 5' 4.7" (1.643 m)   Wt 230 lb 3.2 oz (104.4 kg)   LMP 04/02/2019   SpO2 97%   BMI 38.66 kg/m  Wt Readings from Last 3 Encounters:  08/25/19 230 lb 3.2 oz (104.4 kg)  06/06/19 227 lb (103 kg)  08/26/18 221 lb (100.2 kg)     Lab Results  Component Value Date   WBC 12.0 (H) 06/06/2019   HGB 12.0 06/06/2019   HCT 35.5 (L) 06/06/2019   PLT 228 06/06/2019   GLUCOSE 87 03/10/2018   CHOL 210 (H) 03/10/2018   TRIG 117.0 03/10/2018   HDL 36.30 (L) 03/10/2018   LDLCALC 151 (H) 03/10/2018   ALT 17 03/10/2018   AST 19 03/10/2018   NA 137 03/10/2018   K 4.1 03/10/2018   CL 101 03/10/2018   CREATININE 0.72 03/10/2018   BUN 12 03/10/2018   CO2 31 03/10/2018   TSH 3.32 04/24/2019    Dg Lumbar Spine Complete  Result Date: 08/25/2019 CLINICAL DATA:  Low back pain for several months. EXAM: LUMBAR SPINE - COMPLETE 4+ VIEW COMPARISON:  None. FINDINGS: No acute fracture or spondylolisthesis is noted. Disc spaces are well-maintained. 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 No other abnormality seen in the lumbar spine. Electronically Signed   By: Melissa [name] M.D.   On: 08/25/2019 15:31     Assessment & Plan:  Plan  I am having Melissa [name] start on methocarbamol and meloxicam. I am also having her maintain her levothyroxine, misoprostol, oxyCODONE-acetaminophen, and promethazine.  Meds ordered this encounter  Medications  . methocarbamol (ROBAXIN) 500 MG tablet    Sig: Take 1 tablet (500 mg total) by mouth 4 (four) times daily.    Dispense:  30 tablet    Refill:  1  . meloxicam (MOBIC) 15 MG tablet    Sig: Take 1 tablet (15 mg total) by mouth daily.    Dispense:  30 tablet    Refill:  0    Problem List Items Addressed This Visit      Unprioritized   Acute bilateral low back pain with bilateral sciatica     Muscle relaxer and antiinflammatory Xray  Consider pt vs ortho  Fu pcp 2 weeks if no better       Relevant Medications   methocarbamol (ROBAXIN) 500 MG tablet   meloxicam (MOBIC) 15 MG tablet   Other Relevant Orders   DG Lumbar Spine Complete (Completed)   B12 deficiency    Recheck labs       Relevant Orders   CBC with Differential/Platelet   TSH   Vitamin B12   Comprehensive metabolic panel   Fatigue - Primary   Relevant Orders   CBC with Differential/Platelet  TSH   Vitamin B12   Comprehensive metabolic panel      Follow-up: Return in about 2 weeks (around 09/08/2019), or if symptoms worsen or fail to improve, for f/u pcp in 2 weeks .  Donato Schultz, DO

## 2019-08-25 NOTE — Patient Instructions (Signed)
Acute Back Pain, Adult Acute back pain is sudden and usually short-lived. It is often caused by an injury to the muscles and tissues in the back. The injury may result from:  A muscle or ligament getting overstretched or torn (strained). Ligaments are tissues that connect bones to each other. Lifting something improperly can cause a back strain.  Wear and tear (degeneration) of the spinal disks. Spinal disks are circular tissue that provides cushioning between the bones of the spine (vertebrae).  Twisting motions, such as while playing sports or doing yard work.  A hit to the back.  Arthritis. You may have a physical exam, lab tests, and imaging tests to find the cause of your pain. Acute back pain usually goes away with rest and home care. Follow these instructions at home: Managing pain, stiffness, and swelling  Take over-the-counter and prescription medicines only as told by your health care provider.  Your health care provider may recommend applying ice during the first 24-48 hours after your pain starts. To do this: ? Put ice in a plastic bag. ? Place a towel between your skin and the bag. ? Leave the ice on for 20 minutes, 2-3 times a day.  If directed, apply heat to the affected area as often as told by your health care provider. Use the heat source that your health care provider recommends, such as a moist heat pack or a heating pad. ? Place a towel between your skin and the heat source. ? Leave the heat on for 20-30 minutes. ? Remove the heat if your skin turns bright red. This is especially important if you are unable to feel pain, heat, or cold. You have a greater risk of getting burned. Activity   Do not stay in bed. Staying in bed for more than 1-2 days can delay your recovery.  Sit up and stand up straight. Avoid leaning forward when you sit, or hunching over when you stand. ? If you work at a desk, sit close to it so you do not need to lean over. Keep your chin tucked  in. Keep your neck drawn back, and keep your elbows bent at a right angle. Your arms should look like the letter "L." ? Sit high and close to the steering wheel when you drive. Add lower back (lumbar) support to your car seat, if needed.  Take short walks on even surfaces as soon as you are able. Try to increase the length of time you walk each day.  Do not sit, drive, or stand in one place for more than 30 minutes at a time. Sitting or standing for long periods of time can put stress on your back.  Do not drive or use heavy machinery while taking prescription pain medicine.  Use proper lifting techniques. When you bend and lift, use positions that put less stress on your back: ? Bend your knees. ? Keep the load close to your body. ? Avoid twisting.  Exercise regularly as told by your health care provider. Exercising helps your back heal faster and helps prevent back injuries by keeping muscles strong and flexible.  Work with a physical therapist to make a safe exercise program, as recommended by your health care provider. Do any exercises as told by your physical therapist. Lifestyle  Maintain a healthy weight. Extra weight puts stress on your back and makes it difficult to have good posture.  Avoid activities or situations that make you feel anxious or stressed. Stress and anxiety increase muscle   tension and can make back pain worse. Learn ways to manage anxiety and stress, such as through exercise. General instructions  Sleep on a firm mattress in a comfortable position. Try lying on your side with your knees slightly bent. If you lie on your back, put a pillow under your knees.  Follow your treatment plan as told by your health care provider. This may include: ? Cognitive or behavioral therapy. ? Acupuncture or massage therapy. ? Meditation or yoga. Contact a health care provider if:  You have pain that is not relieved with rest or medicine.  You have increasing pain going down  into your legs or buttocks.  Your pain does not improve after 2 weeks.  You have pain at night.  You lose weight without trying.  You have a fever or chills. Get help right away if:  You develop new bowel or bladder control problems.  You have unusual weakness or numbness in your arms or legs.  You develop nausea or vomiting.  You develop abdominal pain.  You feel faint. Summary  Acute back pain is sudden and usually short-lived.  Use proper lifting techniques. When you bend and lift, use positions that put less stress on your back.  Take over-the-counter and prescription medicines and apply heat or ice as directed by your health care provider. This information is not intended to replace advice given to you by your health care provider. Make sure you discuss any questions you have with your health care provider. Document Released: 11/19/2005 Document Revised: 03/10/2019 Document Reviewed: 07/03/2017 Elsevier Patient Education  2020 [employer]  

## 2019-08-26 ENCOUNTER — Other Ambulatory Visit (INDEPENDENT_AMBULATORY_CARE_PROVIDER_SITE_OTHER): Payer: PPO

## 2019-08-26 DIAGNOSIS — M5441 Lumbago with sciatica, right side: Secondary | ICD-10-CM | POA: Insufficient documentation

## 2019-08-26 DIAGNOSIS — R5383 Other fatigue: Secondary | ICD-10-CM | POA: Insufficient documentation

## 2019-08-26 DIAGNOSIS — R739 Hyperglycemia, unspecified: Secondary | ICD-10-CM

## 2019-08-26 DIAGNOSIS — E538 Deficiency of other specified B group vitamins: Secondary | ICD-10-CM | POA: Insufficient documentation

## 2019-08-26 DIAGNOSIS — M5442 Lumbago with sciatica, left side: Secondary | ICD-10-CM | POA: Insufficient documentation

## 2019-08-26 LAB — CBC WITH DIFFERENTIAL/PLATELET
Basophils Absolute: 0 10*3/uL (ref 0.0–0.1)
Basophils Relative: 0.7 % (ref 0.0–3.0)
Eosinophils Absolute: 0.1 10*3/uL (ref 0.0–0.7)
Eosinophils Relative: 1.8 % (ref 0.0–5.0)
HCT: 42.8 % (ref 36.0–46.0)
Hemoglobin: 14.2 g/dL (ref 12.0–15.0)
Lymphocytes Relative: 32.1 % (ref 12.0–46.0)
Lymphs Abs: 2.1 10*3/uL (ref 0.7–4.0)
MCHC: 33.1 g/dL (ref 30.0–36.0)
MCV: 83 fl (ref 78.0–100.0)
Monocytes Absolute: 0.4 10*3/uL (ref 0.1–1.0)
Monocytes Relative: 5.9 % (ref 3.0–12.0)
Neutro Abs: 3.9 10*3/uL (ref 1.4–7.7)
Neutrophils Relative %: 59.5 % (ref 43.0–77.0)
Platelets: 285 10*3/uL (ref 150.0–400.0)
RBC: 5.15 Mil/uL — ABNORMAL HIGH (ref 3.87–5.11)
RDW: 13.9 % (ref 11.5–15.5)
WBC: 6.6 10*3/uL (ref 4.0–10.5)

## 2019-08-26 LAB — COMPREHENSIVE METABOLIC PANEL
ALT: 18 U/L (ref 0–35)
AST: 16 U/L (ref 0–37)
Albumin: 4.4 g/dL (ref 3.5–5.2)
Alkaline Phosphatase: 66 U/L (ref 39–117)
BUN: 18 mg/dL (ref 6–23)
CO2: 27 mEq/L (ref 19–32)
Calcium: 9.7 mg/dL (ref 8.4–10.5)
Chloride: 99 mEq/L (ref 96–112)
Creatinine, Ser: 0.71 mg/dL (ref 0.40–1.20)
GFR: 94.63 mL/min (ref >60.00)
Glucose, Bld: 120 mg/dL — ABNORMAL HIGH (ref 70–99)
Potassium: 4 mEq/L (ref 3.5–5.1)
Sodium: 136 mEq/L (ref 135–145)
Total Bilirubin: 0.3 mg/dL (ref 0.2–1.2)
Total Protein: 7.3 g/dL (ref 6.0–8.3)

## 2019-08-26 LAB — VITAMIN B12: Vitamin B-12: 228 pg/mL (ref 211–911)

## 2019-08-26 LAB — HEMOGLOBIN A1C: Hgb A1c MFr Bld: 5.7 % (ref 4.6–6.5)

## 2019-08-26 LAB — TSH: TSH: 1.51 u[IU]/mL (ref 0.35–4.50)

## 2019-08-26 NOTE — Assessment & Plan Note (Signed)
Muscle relaxer and antiinflammatory Xray  Consider pt vs ortho  Fu pcp 2 weeks if no better

## 2019-08-26 NOTE — Assessment & Plan Note (Signed)
Recheck labs 

## 2019-09-08 ENCOUNTER — Other Ambulatory Visit: Payer: Self-pay

## 2019-09-08 ENCOUNTER — Ambulatory Visit (INDEPENDENT_AMBULATORY_CARE_PROVIDER_SITE_OTHER): Payer: PPO | Admitting: Family

## 2019-09-08 ENCOUNTER — Encounter: Payer: Self-pay | Admitting: Family

## 2019-09-08 VITALS — BP 117/70 | HR 77 | Temp 98.1°F | Resp 16 | Wt 231.0 lb

## 2019-09-08 DIAGNOSIS — R5383 Other fatigue: Secondary | ICD-10-CM

## 2019-09-08 DIAGNOSIS — E538 Deficiency of other specified B group vitamins: Secondary | ICD-10-CM

## 2019-09-08 DIAGNOSIS — M5442 Lumbago with sciatica, left side: Secondary | ICD-10-CM

## 2019-09-08 DIAGNOSIS — M5441 Lumbago with sciatica, right side: Secondary | ICD-10-CM

## 2019-09-08 DIAGNOSIS — E039 Hypothyroidism, unspecified: Secondary | ICD-10-CM | POA: Diagnosis not present

## 2019-09-08 MED ORDER — CYANOCOBALAMIN 500 MCG PO TABS: 500.0000 ug | ORAL_TABLET | Freq: Every day | ORAL | Status: AC

## 2019-09-08 NOTE — Progress Notes (Signed)
Subjective:    Patient ID: Melissa [name], female    DOB: 1986/05/14, 33 y.o.   MRN: [id number]  HPI  Patient is a 33 yr old female who presents today for follow up. Her husband is on facetime for today's visit due to COVID-19 visitor restrictions and helps with translation.   Back pain-  Reports that pain comes and goes. Reports that pain is located in the lower back.    Fatigue- reports that fatigue is a little better.  Reports that she is sleeping 5-7 hours each night.  Hypothyroid- she is maintained on synthroid 142mcg once daily. Lab Results  Component Value Date   TSH 1.51 08/25/2019   Husband tells me that she was pregnant over the summer but suffered a miscarriage.  Review of Systems    see HPI  Past Medical History:  Diagnosis Date  . Hypothyroidism      Social History   Socioeconomic History  . Marital status: Married    Spouse name: Not on file  . Number of children: Not on file  . Years of education: Not on file  . Highest education level: Not on file  Occupational History  . Not on file  Social Needs  . Financial resource strain: Not on file  . Food insecurity    Worry: Not on file    Inability: Not on file  . Transportation needs    Medical: Not on file    Non-medical: Not on file  Tobacco Use  . Smoking status: Never Smoker  . Smokeless tobacco: Never Used  Substance and Sexual Activity  . Alcohol use: No  . Drug use: No  . Sexual activity: Not on file  Lifestyle  . Physical activity    Days per week: Not on file    Minutes per session: Not on file  . Stress: Not on file  Relationships  . Social Herbalist on phone: Not on file    Gets together: Not on file    Attends religious service: Not on file    Active member of club or organization: Not on file    Attends meetings of clubs or organizations: Not on file    Relationship status: Not on file  . Intimate partner violence    Fear of current or ex partner: Not on file   Emotionally abused: Not on file    Physically abused: Not on file    Forced sexual activity: Not on file  Other Topics Concern  . 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 CESAREAN SECTION    . CESAREAN SECTION N/A 04/29/2015   Procedure: CESAREAN SECTION;  Surgeon: Aloha Gell, MD;  Location: [facility];  Service: Obstetrics;  Laterality: N/A;  . EYE SURGERY     Lasik    Family History  Problem Relation Age of Onset  . Diabetes Mother   . Thyroid disease Mother   . Diabetes Father   . Hypertension Father   . Hyperlipidemia Father     No Known Allergies  Current Outpatient Medications on File Prior to Visit  Medication Sig Dispense Refill  . levothyroxine (SYNTHROID) 125 MCG tablet Take 1 tablet (125 mcg total) by mouth daily. [place]  tablet 1  . meloxicam (MOBIC) 15 MG tablet Take 1 tablet (15 mg total) by mouth daily. 30 tablet 0  . methocarbamol (ROBAXIN) 500 MG tablet Take 1 tablet (500 mg total) by mouth 4 (four) times daily. 30 tablet 1  . misoprostol (CYTOTEC) 200 MCG tablet Take 3 tablets (600 mcg total) by mouth once for 1 dose. Place 3 tablets into your cheek and let them dissolve to a paste before swallowing. 2 tablet 0  . oxyCODONE-acetaminophen (PERCOCET/ROXICET) 5-325 MG tablet Take 2 tablets by mouth every 6 (six) hours as needed for severe pain. (Patient not taking: Reported on 08/25/2019) 6 tablet 0  . promethazine (PHENERGAN) 25 MG tablet Take 0.5-1 tablets (12.5-25 mg total) by mouth every 6 (six) hours as needed for nausea. (Patient not taking: Reported on 08/25/2019) 30 tablet 0   No current facility-administered medications on file prior to visit.     BP 117/70 (BP Location: Left Arm, Patient Position: Sitting, Cuff Size: Large)    Pulse 77   Temp 98.1 F (36.7 C) (Temporal)   Resp 16   Wt 231 lb (104.8 kg)   LMP 04/02/2019   SpO2 97%   BMI 38.80 kg/m    Objective:   Physical Exam Constitutional:      Appearance: She is well-developed.  Cardiovascular:     Rate and Rhythm: Normal rate and regular rhythm.     Heart sounds: Normal heart sounds. No murmur.  Pulmonary:     Effort: Pulmonary effort is normal. No respiratory distress.     Breath sounds: Normal breath sounds. No wheezing.  Musculoskeletal:     Lumbar back: She exhibits tenderness.  Psychiatric:        Behavior: Behavior normal.        Thought Content: Thought content normal.        Judgment: Judgment normal.           Assessment & Plan:  Hypothyroid- TSH normal. Continue current dose of synthroid.  Hyperglycemia- noted on recent labs. Counseled on need to limit carbs and concentrated sweets. Also discussed weight loss and exercise.  Fatigue- only sleeping 5-7 hrs a night. Advised her to work on getting at least 8 hrs a night.  Low back pain- slight improvement. Pt was advised to start exercises as outlined on AVS twice daily and to let me know if symptoms are not improved in a few weeks. Would plan referral to PT at that time.  B12 deficiency- borderline low b12 level. Advised pt to add oral b12 supplement. Plan to repeat level in 3 months. If still low will need to consider b12 injections.

## 2019-09-08 NOTE — Patient Instructions (Signed)

## 2019-09-16 ENCOUNTER — Other Ambulatory Visit: Payer: Self-pay | Admitting: Family Medicine

## 2019-09-16 DIAGNOSIS — M5441 Lumbago with sciatica, right side: Secondary | ICD-10-CM

## 2019-09-16 DIAGNOSIS — M5442 Lumbago with sciatica, left side: Secondary | ICD-10-CM

## 2019-11-11 ENCOUNTER — Other Ambulatory Visit: Payer: Self-pay

## 2019-11-11 ENCOUNTER — Emergency Department (HOSPITAL_COMMUNITY): Payer: PPO

## 2019-11-11 ENCOUNTER — Emergency Department (HOSPITAL_COMMUNITY)
Admission: EM | Admit: 2019-11-11 | Discharge: 2019-11-11 | Disposition: A | Discharge status: Home / Self Care | Payer: PPO | Attending: Emergency Medicine | Admitting: Emergency Medicine

## 2019-11-11 ENCOUNTER — Encounter (HOSPITAL_COMMUNITY): Payer: Self-pay

## 2019-11-11 ENCOUNTER — Ambulatory Visit: Payer: Self-pay

## 2019-11-11 DIAGNOSIS — I471 Supraventricular tachycardia: Secondary | ICD-10-CM | POA: Insufficient documentation

## 2019-11-11 DIAGNOSIS — E039 Hypothyroidism, unspecified: Secondary | ICD-10-CM | POA: Insufficient documentation

## 2019-11-11 DIAGNOSIS — I493 Ventricular premature depolarization: Secondary | ICD-10-CM

## 2019-11-11 DIAGNOSIS — Z79899 Other long term (current) drug therapy: Secondary | ICD-10-CM | POA: Diagnosis not present

## 2019-11-11 DIAGNOSIS — R002 Palpitations: Secondary | ICD-10-CM

## 2019-11-11 DIAGNOSIS — R000 Tachycardia, unspecified: Secondary | ICD-10-CM | POA: Diagnosis present

## 2019-11-11 LAB — CBC
HCT: 42 % (ref 36.0–46.0)
Hemoglobin: 13.6 g/dL (ref 12.0–15.0)
MCH: 27.1 pg (ref 26.0–34.0)
MCHC: 32.4 g/dL (ref 30.0–36.0)
MCV: 83.8 fL (ref 80.0–100.0)
Platelets: 232 10*3/uL (ref 150–400)
RBC: 5.01 MIL/uL (ref 3.87–5.11)
RDW: 14.1 % (ref 11.5–15.5)
WBC: 6.1 10*3/uL (ref 4.0–10.5)
nRBC: 0 % (ref 0.0–0.2)

## 2019-11-11 LAB — BASIC METABOLIC PANEL
Anion gap: 8 (ref 5–15)
BUN: 14 mg/dL (ref 6–20)
CO2: 28 mmol/L (ref 22–32)
Calcium: 9.5 mg/dL (ref 8.9–10.3)
Chloride: 103 mmol/L (ref 98–111)
Creatinine, Ser: 0.67 mg/dL (ref 0.44–1.00)
GFR calc Af Amer: >60 mL/min (ref >60)
GFR calc non Af Amer: >60 mL/min (ref >60)
Glucose, Bld: 105 mg/dL — ABNORMAL HIGH (ref 70–99)
Potassium: 4.2 mmol/L (ref 3.5–5.1)
Sodium: 139 mmol/L (ref 135–145)

## 2019-11-11 LAB — I-STAT BETA HCG BLOOD, ED (MC, WL, AP ONLY): I-stat hCG, quantitative: <5 m[IU]/mL (ref <5)

## 2019-11-11 LAB — TROPONIN I (HIGH SENSITIVITY): Troponin I (High Sensitivity): <2 ng/L (ref <18)

## 2019-11-11 NOTE — Telephone Encounter (Signed)
Patient called and with interpreter# [phone]  She stated that she has had a rapid heartbeat that comes and goes for 2 days. She states that she can feel it fast as we are speaking. She states it makes her SOB.  She denies chest pain and other symptoms. She has no prior Hx of heart issues. Per protocol patient will go to ER for evaluation of her heart rate. Care advice read to patient. She verbalized understanding.  Reason for Disposition . [1] Heart beating very rapidly (e.g., > 140 / minute) AND [2] present now  (Exception: during exercise)  Answer Assessment - Initial Assessment Questions 1. DESCRIPTION: "Please describe your heart rate or heart beat that you are having" (e.g., fast/slow, regular/irregular, skipped or extra beats, "palpitations")     To fast   2. ONSET: "When did it start?" (Minutes, hours or days)      2 days ago 3. DURATION: "How long does it last" (e.g., seconds, minutes, hours)     5 seconds 4. PATTERN "Does it come and go, or has it been constant since it started?"  "Does it get worse with exertion?"   "Are you feeling it now?"     Comes and goes , yes now 5. TAP: "Using your hand, can you tap out what you are feeling on a chair or table in front of you, so that I can hear?" (Note: not all patients can do this)       Unable to understand 6. HEART RATE: "Can you tell me your heart rate?" "How many beats in 15 seconds?"  (Note: not all patients can do this)       unable 7. RECURRENT SYMPTOM: "Have you ever had this before?" If so, ask: "When was the last time?" and "What happened that time?"      no 8. CAUSE: "What do you think is causing the palpitations?"     no 9. CARDIAC HISTORY: "Do you have any history of heart disease?" (e.g., heart attack, angina, bypass surgery, angioplasty, arrhythmia)      no 10. OTHER SYMPTOMS: "Do you have any other symptoms?" (e.g., dizziness, chest pain, sweating, difficulty breathing)     SOB, 11. PREGNANCY: "Is there any chance you are  pregnant?" "When was your last menstrual period?"      Unsure, Nov 10th  Protocols used: HEART RATE AND HEARTBEAT QUESTIONS-A-AH

## 2019-11-11 NOTE — ED Notes (Signed)
Arabic interpreter used for discharge,all questions answered, pt verbalized understanding. Pt a.o, nad noted

## 2019-11-11 NOTE — ED Triage Notes (Addendum)
Pt arrives POV for eval of episodic subjective tachycardia. Pt reports that she will be sitting at rest at home not doing anything and she notes episodes where she feels like her heart is racing, which spontaneously resolve in about 5 minutes. Pt reports onset of these episodes 2 days ago. Pt reports associated SOB only during episodes of shortness of breath. Pt denies palpitations, but states that she knows her heart is going fast because she feels it when she places her hand on her chest. Afebrile in triage, denies chest pain, denies N/V,diaphoresis, denies known hx of cardiac problems

## 2019-11-11 NOTE — Discharge Instructions (Addendum)
Your evaluation in the ED today is without any abnormality.  I recommend you follow-up with cardiology to be evaluated.  During her stay with [place] in the ED her EKG was normal and the picture we took of your lungs was normal.  If you experience any symptoms of chest pain dizziness or other new or concerning symptoms please return to the ED.  Otherwise please call the number for the cardiologist that is included in your paperwork to schedule appointment with them.  Please avoid excessive caffeine in the meantime.  Drink plenty of water and make sure you get plenty of sleep.

## 2019-11-11 NOTE — ED Provider Notes (Signed)
[facility] EMERGENCY DEPARTMENT Provider Note   CSN: [id number] Arrival date & time: 11/11/19  1125     History   Chief Complaint Chief Complaint  Patient presents with   Tachycardia   Shortness of Breath    HPI Fatema Rabe is a 33 y.o. female with history of hypothyroidism on thyroxine     HPI Patient is 33 year old female presented today for intermittent episodes of tachycardia.  Patient states that 2 days ago she had 3 episodes that lasted 5 to 6 seconds where she felt that her heart was beating fast.  She states that today she had several episodes as well.  Patient states that during these episodes she does not feel lightheaded, chest pain, nausea, vomiting, dizziness but does endorse some mild tightness in her chest.  Denies any difficulty breathing, coughing or choking.  Patient states that she drinks 1 to 2 cups of coffee per day.  Denies any illicit drug use or alcohol use.  Does endorse occasional caffeinated soda use.  Patient denies any history of cardiac disease denies any syncope or presyncope.  Denies any exertional component to symptoms.    Denies any recent illness.  Denies any chronic medical conditions other than hypothyroidism which she states she takes her levothyroxine for which she has been on long-term with no recent changes.  Denies any heat or cold intolerance, weight gain, swelling in her legs, or face.  Denies any fevers or chills.  Denies any increase or decrease appetite.  No recent changes in weight.   Past Medical History:  Diagnosis Date   Hypothyroidism     Patient Active Problem List   Diagnosis Date Noted   Fatigue 08/26/2019   B12 deficiency 08/26/2019   Acute bilateral low back pain with bilateral sciatica 08/26/2019   Idiopathic polyneuropathy 02/14/2018   Preventative health care 12/27/2015   Vitamin D deficiency 12/27/2015   Hypothyroidism 05/03/2015    Past Surgical History:  Procedure Laterality  Date   CESAREAN SECTION     CESAREAN SECTION N/A 04/29/2015   Procedure: CESAREAN SECTION;  Surgeon: Noland Fordyce, MD;  Location: [facility];  Service: Obstetrics;  Laterality: N/A;   EYE SURGERY     Lasik     OB History    Gravida  3   Para  2   Term  2   Preterm  0   AB  0   Living  2     SAB  0   TAB  0   Ectopic  0   Multiple  0   Live Births  2            Home Medications    Prior to Admission medications   Medication Sig Start Date End Date Taking? Authorizing Provider  levothyroxine (SYNTHROID) 125 MCG tablet Take 1 tablet (125 mcg total) by mouth daily. 04/28/19  Yes Sandford Craze, NP  meloxicam (MOBIC) 15 MG tablet TAKE 1 TABLET BY MOUTH EVERY DAY Patient not taking: Reported on 11/11/2019 09/16/19   Donato Schultz, DO  methocarbamol (ROBAXIN) 500 MG tablet Take 1 tablet (500 mg total) by mouth 4 (four) times daily. Patient not taking: Reported on 11/11/2019 08/25/19   Donato Schultz, DO  vitamin B-12 (CYANOCOBALAMIN) 500 MCG tablet Take 1 tablet (500 mcg total) by mouth daily. Patient not taking: Reported on 11/11/2019 09/08/19   Sandford Craze, NP    Family History Family History  Problem Relation Age of Onset  Diabetes Mother    Thyroid disease Mother    Diabetes Father    Hypertension Father    Hyperlipidemia Father     Social History Social History   Tobacco Use   Smoking status: Never Smoker   Smokeless tobacco: Never Used  Substance Use Topics   Alcohol use: No   Drug use: No     Allergies   Patient has no known allergies.   Review of Systems Review of Systems  Constitutional: Negative for chills and fever.  HENT: Negative for congestion.   Eyes: Negative for pain.  Respiratory: Positive for shortness of breath. Negative for cough.   Cardiovascular: Positive for palpitations. Negative for chest pain and leg swelling.  Gastrointestinal: Negative for abdominal pain and vomiting.    Genitourinary: Negative for dysuria.  Musculoskeletal: Negative for myalgias.  Skin: Negative for rash.  Neurological: Negative for dizziness and headaches.     Physical Exam Updated Vital Signs BP 119/75 (BP Location: Left Arm)    Pulse 68    Temp 98.4 F (36.9 C)    Resp 18    Ht 5' 4.57" (1.64 m)    Wt 99.8 kg    LMP 04/02/2019    SpO2 100%    BMI 37.10 kg/m   Physical Exam Vitals signs and nursing note reviewed.  Constitutional:      General: She is not in acute distress. HENT:     Head: Normocephalic and atraumatic.     Nose: Nose normal.  Eyes:     General: No scleral icterus. Neck:     Musculoskeletal: Normal range of motion.  Cardiovascular:     Rate and Rhythm: Normal rate and regular rhythm.     Pulses: Normal pulses.     Heart sounds: Normal heart sounds.  Pulmonary:     Effort: Pulmonary effort is normal. No respiratory distress.     Breath sounds: No wheezing.  Abdominal:     Palpations: Abdomen is soft.     Tenderness: There is no abdominal tenderness.  Musculoskeletal:     Right lower leg: No edema.     Left lower leg: No edema.     Comments: No calf swelling or tenderness  Skin:    General: Skin is warm and dry.     Capillary Refill: Capillary refill takes less than 2 seconds.  Neurological:     Mental Status: She is alert. Mental status is at baseline.  Psychiatric:        Mood and Affect: Mood normal.        Behavior: Behavior normal.      ED Treatments / Results  Labs (all labs ordered are listed, but only abnormal results are displayed) Labs Reviewed  BASIC METABOLIC PANEL - Abnormal; Notable for the following components:      Result Value   Glucose, Bld 105 (*)    All other components within normal limits  CBC  I-STAT BETA HCG BLOOD, ED ([facility], [facility], [facility] ONLY)  TROPONIN I (HIGH SENSITIVITY)    EKG EKG Interpretation  Date/Time:  Wednesday November 11 2019 11:49:19 EST Ventricular Rate:  72 PR Interval:  144 QRS Duration: 78 QT  Interval:  396 QTC Calculation: 433 R Axis:   21 Text Interpretation: Normal sinus rhythm Normal ECG no wpw, brugada or prolonged qt No old tracing to compare Confirmed by Melene PlanFloyd, Dan [phone]([id number]) on 11/11/2019 1:21:20 PM   Radiology Dg Chest 2 View  Result Date: 11/11/2019 CLINICAL DATA:  Tachycardia EXAM: CHEST -  2 VIEW COMPARISON:  None. FINDINGS: The heart size and mediastinal contours are within normal limits. Both lungs are clear. No pleural effusion or pneumothorax. The visualized skeletal structures are unremarkable. IMPRESSION: No acute process in the chest. Electronically Signed   By: Macy Mis M.D.   On: 11/11/2019 13:10    Procedures Procedures (including critical care time)  Medications Ordered in ED Medications - No data to display   Initial Impression / Assessment and Plan / ED Course  I have reviewed the triage vital signs and the nursing notes.  Pertinent labs & imaging results that were available during my care of the patient were reviewed by me and considered in my medical decision making (see chart for details).  Clinical Course as of Nov 10 1500  Wed Nov 11, 2019  1405 ED EKG [WF]  1423 EKG independently reviewed by myself no evidence of ST elevation, no arrhythmia she has normal sinus rhythm.  No evidence of right heart strain.  No Wolff-Parkinson-White, LGL, short QT, Brugada.  ED EKG [WF]  1424 Without any evident abnormality.  DG Chest 2 View [WF]  1424 Unremarkable CBC  CBC [WF]  1424 BMP without electrolyte abnormality  Basic metabolic panel(!) [WF]  7026 Not pregnant  I-Stat beta hCG blood, ED [WF]  1424 Troponin within normal limits.  Troponin I (High Sensitivity) [WF]    Clinical Course User Index [WF] Tedd Sias, [place]       Patient is 33 year old female with no past medical history other than hypothyroidism which is well controlled with levothyroxine which has not been changed recently.  Patient does not have any symptoms of thyroid storm  as she is not experiencing heat or cold intolerance no weight changes no facial edema, altered mental status or confusion.  She denies any new medications no illicit drugs alcohol use.  States that she drinks 1 to 2 cups of coffee a day.  Denies any change in frequency of this.  Patient is not having any chest pain that would be concerning for pulmonary embolism, dissection, tamponade, pericarditis or other cardiac or pulmonary etiology.   Patient has no risk factors for ACS.  She also denies any chest pain doubt pulmonary embolism she does not have any exposing factors nor does she have symptoms of a PE.  Will check electrolytes, EKG and put monitor.   Chest x-ray and troponin ordered in triage are within normal limits.  She does not require a repeat troponin at this time.  EKG is within normal limits without tachycardia.  See above read. BMP is with electrolyte abnormalities CBC without abnormality.  Patient is not anemic or hypoglycemic.  Patient on monitor during ED visit without arrhythmia, tachycardia, or concerning changes during stay.   Reassess patient 2:50 PM.  She has had no episodes of tachycardia.  Continues to feel asymptomatic during ED visit.  Patient has healthy 33 year old female with very few risk factors for cardiac disease.  Recommend she follow-up with cardiology outpatient for assessment and consideration of Holter monitor or other long-term cardiac event monitor.   Patient given strict return precautions including chest pain, worsening symptoms lightheadedness or dizziness.  Patient is understanding of plan.  Will follow up with cardiology.   Final Clinical Impressions(s) / ED Diagnoses   Final diagnoses:  Palpitations    ED Discharge Orders    None       Tedd Sias, [place] 11/11/19 [place], DO 11/11/19 1537

## 2019-11-16 ENCOUNTER — Other Ambulatory Visit: Payer: Self-pay

## 2019-11-16 ENCOUNTER — Ambulatory Visit (INDEPENDENT_AMBULATORY_CARE_PROVIDER_SITE_OTHER): Payer: PPO | Admitting: Family Medicine

## 2019-11-16 ENCOUNTER — Encounter: Payer: Self-pay | Admitting: Family Medicine

## 2019-11-16 VITALS — BP 124/82 | HR 78 | Temp 97.7°F | Resp 16 | Ht 64.0 in | Wt 232.0 lb

## 2019-11-16 DIAGNOSIS — M5441 Lumbago with sciatica, right side: Secondary | ICD-10-CM

## 2019-11-16 DIAGNOSIS — G8929 Other chronic pain: Secondary | ICD-10-CM

## 2019-11-16 DIAGNOSIS — N926 Irregular menstruation, unspecified: Secondary | ICD-10-CM | POA: Diagnosis not present

## 2019-11-16 LAB — POCT URINE PREGNANCY: Preg Test, Ur: NEGATIVE

## 2019-11-16 MED ORDER — PREDNISONE 20 MG PO TABS: ORAL_TABLET | ORAL | 0 refills | Status: DC

## 2019-11-16 MED ORDER — CYCLOBENZAPRINE HCL 10 MG PO TABS: 5.0000 mg | ORAL_TABLET | Freq: Two times a day (BID) | ORAL | 0 refills | Status: AC | PRN

## 2019-11-16 NOTE — Progress Notes (Signed)
[place] at [facility] [address] [place], [place] [address] [id number] [phone]  Date:  11/16/2019   Name:  Melissa [name]   DOB:  June 21, 1986   MRN:  [id number]  PCP:  Debbrah Alar, NP    Chief Complaint: Back Pain (lower back pain, last tuesday, no known injury)   History of Present Illness:  Melissa [name] is a 33 y.o. very pleasant female patient who presents with the following:  Generally healthy young woman who typically sees [place]. 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 No other abnormality seen in the lumbar spine.  For about one week now she has noted pain in her lower back, worse yesterday but somewhat better today No known injury The pain is present in her bilateral lower back, yesterday the pain was going down the right leg as well-this seems to have resolved today  Her husband is present today and helps with interpretation  She is not taking any medication for her back pain right now  About possible pregnancy, patient and her husband said they are not sure.  She expecting her period last week but it did not appear She did take a home test last week which was negative  Right now she is just taking her thyroid medication-no other over-the-counter medications  Lab Results  Component Value Date   TSH 1.51 08/25/2019      Patient Active Problem List   Diagnosis Date Noted  . Fatigue 08/26/2019  . B12 deficiency 08/26/2019  . Acute bilateral low back pain with bilateral sciatica 08/26/2019  . Idiopathic polyneuropathy 02/14/2018  . Preventative health care 12/27/2015  . Vitamin D deficiency 12/27/2015  . Hypothyroidism 05/03/2015    Past Medical  History:  Diagnosis Date  . Hypothyroidism     Past Surgical History:  Procedure Laterality Date  . CESAREAN SECTION    . CESAREAN SECTION N/A 04/29/2015   Procedure: CESAREAN SECTION;  Surgeon: Aloha Gell, MD;  Location: [facility];  Service: Obstetrics;  Laterality: N/A;  . EYE SURGERY     Lasik    Social History   Tobacco Use  . Smoking status: Never Smoker  . Smokeless tobacco: Never Used  Substance Use Topics  . Alcohol use: No  . Drug use: No    Family History  Problem Relation Age of Onset  . Diabetes Mother   . Thyroid disease Mother   . Diabetes Father   . Hypertension Father   . Hyperlipidemia Father     No Known Allergies  Medication list has been reviewed and updated.  Current Outpatient Medications on File Prior to Visit  Medication Sig Dispense Refill  . levothyroxine (SYNTHROID) 125 MCG tablet Take 1 tablet (125 mcg total) by mouth daily. 90 tablet 1  . vitamin B-12 (CYANOCOBALAMIN) 500 MCG tablet Take 1 tablet (500 mcg total) by mouth daily. (Patient not taking: Reported on 11/11/2019)     No current facility-administered medications on file prior to visit.    Review of Systems:  As per HPI- otherwise negative. No fever or chills  Physical Examination: Vitals:   11/16/19 1115  BP: 124/82  Pulse: 78  Resp: 16  Temp: 97.7 F (36.5 C)  SpO2: 98%   Vitals:  11/16/19 1115  Weight: 232 lb (105.2 kg)  Height: 5\' 4"  (1.626 m)   Body mass index is 39.82 kg/m. Ideal Body Weight: Weight in (lb) to have BMI = 25: 145.3  GEN: WDWN, NAD, Non-toxic, A & O x 3, obese, looks well Wearing traditional head covering which covers her face except for her eyes PEERL HEENT: Atraumatic, Normocephalic. Neck supple. No masses, No LAD. Ears and Nose: No external deformity. CV: RRR, No M/G/R. No JVD. No thrill. No extra heart sounds. PULM: CTA B, no wheezes, crackles, rhonchi. No retractions. No resp. distress. No accessory muscle use. ABD: S, NT, ND,  +BS. No rebound. No HSM. EXTR: No c/c/e NEURO moving more slowly and stiffly than expected for age due to back pain Normal bilateral lower extremity strength, sensation, DTR Positive straight leg raise on the right The patient points out the musculature of her bilateral lumbar area to palpation Thoracolumbar flexion and extension are limited due to pain PSYCH: Normally interactive. Conversant. Not depressed or anxious appearing.  Calm demeanor.   Results for orders placed or performed in visit on 11/16/19  POCT urine pregnancy  Result Value Ref Range   Preg Test, Ur Negative Negative     Assessment and Plan: Chronic bilateral low back pain with right-sided sciatica - Plan: predniSONE (DELTASONE) 20 MG tablet, cyclobenzaprine (FLEXERIL) 10 MG tablet  Missed menses - Plan: POCT urine pregnancy  Here today with concern of lower back pain which has been present on and off for the last 3 months. I suspect she may have a bulging disc due to pain running down the right leg yesterday We will treat her with prednisone for 6 days, Flexeril as needed Advised tylenol over-the-counter  hCG negative today-advised her to take a home pregnancy test once weekly until menses occur.  If no menses in 1 month, please let 11/18/19 know.  If she is pregnant, please call so we can discuss her medications Avoid NSAIDs in case she may be pregnant  This visit occurred during the SARS-CoV-2 public health emergency.  Safety protocols were in place, including screening questions prior to the visit, additional usage of staff PPE, and extensive cleaning of exam room while observing appropriate contact time as indicated for disinfecting solutions.    Signed [place], MD

## 2019-11-16 NOTE — Patient Instructions (Signed)
It was good to see you today but I am sorry that your back hurts!  We will try prednisone for 6 days, and also flexeril as needed for pain. The flexeril can make you drowsy so do not drive while using it Tylenol can also be helpful  Your pregnancy test is negative today- continue to take a home pregnancy test weekly until your period appears.  Please let us know if no period in a month  Please let us know if your back is nor better with the prednisone treatment

## 2019-11-19 ENCOUNTER — Other Ambulatory Visit: Payer: Self-pay | Admitting: Family

## 2019-11-19 DIAGNOSIS — E039 Hypothyroidism, unspecified: Secondary | ICD-10-CM

## 2019-11-19 NOTE — Telephone Encounter (Signed)
Last OV 11/16/19 Last refill 04/28/19 #90/1 Next OV 12/08/19

## 2019-12-08 ENCOUNTER — Ambulatory Visit: Payer: PPO | Admitting: Family

## 2020-02-29 ENCOUNTER — Encounter: Payer: Self-pay | Admitting: Nurse Practitioner

## 2020-02-29 ENCOUNTER — Other Ambulatory Visit: Payer: Self-pay

## 2020-02-29 ENCOUNTER — Ambulatory Visit (INDEPENDENT_AMBULATORY_CARE_PROVIDER_SITE_OTHER): Payer: PPO | Admitting: Nurse Practitioner

## 2020-02-29 VITALS — BP 115/69 | HR 72 | Temp 97.4°F | Resp 18 | Ht 66.0 in | Wt 213.8 lb

## 2020-02-29 DIAGNOSIS — M5441 Lumbago with sciatica, right side: Secondary | ICD-10-CM

## 2020-02-29 DIAGNOSIS — M5416 Radiculopathy, lumbar region: Secondary | ICD-10-CM | POA: Diagnosis not present

## 2020-02-29 DIAGNOSIS — G8929 Other chronic pain: Secondary | ICD-10-CM

## 2020-02-29 LAB — POCT URINE PREGNANCY: Preg Test, Ur: NEGATIVE

## 2020-02-29 MED ORDER — PREDNISONE 20 MG PO TABS: ORAL_TABLET | ORAL | 0 refills | Status: AC

## 2020-02-29 NOTE — Patient Instructions (Addendum)
It was very nice to meet you today.   I have placed an order for the lumbar MRI and you will get a call once we get your insurance approval.  Please await a call from the scheduling department.  If you have not heard from [place] in 1 week, please call the office.   I have re ordered the prednisone to help decrease the back inflammation and this should help with your right leg and knee pain.   You may take Tylenol as needed per directions on the bottle.   If you develop trouble holding your urine or stool, or much worse leg weakness or pain - go to the emergency room for more immediate assistance.    Acute Back Pain, Adult Acute back pain is sudden and usually short-lived. It is often caused by an injury to the muscles and tissues in the back. The injury may result from:  A muscle or ligament getting overstretched or torn (strained). Ligaments are tissues that connect bones to each other. Lifting something improperly can cause a back strain.  Wear and tear (degeneration) of the spinal disks. Spinal disks are circular tissue that provides cushioning between the bones of the spine (vertebrae).  Twisting motions, such as while playing sports or doing yard work.  A hit to the back.  Arthritis. You may have a physical exam, lab tests, and imaging tests to find the cause of your pain. Acute back pain usually goes away with rest and home care. Follow these instructions at home: Managing pain, stiffness, and swelling  Take over-the-counter and prescription medicines only as told by your health care provider.  Your health care provider may recommend applying ice during the first 24-48 hours after your pain starts. To do this: ? Put ice in a plastic bag. ? Place a towel between your skin and the bag. ? Leave the ice on for 20 minutes, 2-3 times a day.  If directed, apply heat to the affected area as often as told by your health care provider. Use the heat source that your health care provider  recommends, such as a moist heat pack or a heating pad. ? Place a towel between your skin and the heat source. ? Leave the heat on for 20-30 minutes. ? Remove the heat if your skin turns bright red. This is especially important if you are unable to feel pain, heat, or cold. You have a greater risk of getting burned. Activity   Do not stay in bed. Staying in bed for more than 1-2 days can delay your recovery.  Sit up and stand up straight. Avoid leaning forward when you sit, or hunching over when you stand. ? If you work at a desk, sit close to it so you do not need to lean over. Keep your chin tucked in. Keep your neck drawn back, and keep your elbows bent at a right angle. Your arms should look like the letter "L." ? Sit high and close to the steering wheel when you drive. Add lower back (lumbar) support to your car seat, if needed.  Take short walks on even surfaces as soon as you are able. Try to increase the length of time you walk each day.  Do not sit, drive, or stand in one place for more than 30 minutes at a time. Sitting or standing for long periods of time can put stress on your back.  Do not drive or use heavy machinery while taking prescription pain medicine.  Use proper  lifting techniques. When you bend and lift, use positions that put less stress on your back: ? [place] your knees. ? Keep the load close to your body. ? Avoid twisting.  Exercise regularly as told by your health care provider. Exercising helps your back heal faster and helps prevent back injuries by keeping muscles strong and flexible.  Work with a physical therapist to make a safe exercise program, as recommended by your health care provider. Do any exercises as told by your physical therapist. Lifestyle  Maintain a healthy weight. Extra weight puts stress on your back and makes it difficult to have good posture.  Avoid activities or situations that make you feel anxious or stressed. Stress and anxiety  increase muscle tension and can make back pain worse. Learn ways to manage anxiety and stress, such as through exercise. General instructions  Sleep on a firm mattress in a comfortable position. Try lying on your side with your knees slightly bent. If you lie on your back, put a pillow under your knees.  Follow your treatment plan as told by your health care provider. This may include: ? Cognitive or behavioral therapy. ? Acupuncture or massage therapy. ? Meditation or yoga. Contact a health care provider if:  You have pain that is not relieved with rest or medicine.  You have increasing pain going down into your legs or buttocks.  Your pain does not improve after 2 weeks.  You have pain at night.  You lose weight without trying.  You have a fever or chills. Get help right away if:  You develop new bowel or bladder control problems.  You have unusual weakness or numbness in your arms or legs.  You develop nausea or vomiting.  You develop abdominal pain.  You feel faint. Summary  Acute back pain is sudden and usually short-lived.  Use proper lifting techniques. When you bend and lift, use positions that put less stress on your back.  Take over-the-counter and prescription medicines and apply heat or ice as directed by your health care provider. This information is not intended to replace advice given to you by your health care provider. Make sure you discuss any questions you have with your health care provider. Document Revised: 03/10/2019 Document Reviewed: 07/03/2017 Elsevier Patient Education  [place].

## 2020-02-29 NOTE — Progress Notes (Signed)
Established Patient Office Visit  Subjective:  Patient ID: Melissa [name], female    DOB: Jul 22, 1986  Age: 34 y.o. MRN: [id number]  CC:  Chief Complaint  Patient presents with  . Knee Pain    right , onset: 10 days  . Back Pain    low back    HPI Melissa [name] presents for chronic lower back pain of > 6 week duration with 1-1.5 week history of right leg pain and perceived weakness. She reports her Rt knee hurts from the thigh to just below the knee cap and is her chief complaint today. No injury or trauma to the knee.  All communication today is through her husband who is translating through the cell phone on speaker.  This patient is not [place] speaking.  Reports low back pain for a "long time". 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 She was seen in the clinic for bilat low back pain 11/15/2020 and reports the prednisone taper helped for a short while- but the pain in her lumbar back region never left.   Her back pain is more localized to the right lumbar today. The symptoms down the right leg are new and the knee pain is what brought her into the clinic today.  She also requests a check on her Vit D level. No bowel or bladder problems.  She reports normal sensation without numbness or tingling to the right leg.  The right leg does feel weaker when she walks on it than the left.  She has noted no fevers or chills, no systemic illness.  She has taken no medication for her pain.  She did achieve regular menstrual cycles and her last menstrual period was 02/11/20 normal.  She does not use any type of  birth control.  Past Medical History:  Diagnosis Date  . Hypothyroidism     Past Surgical History:  Procedure Laterality Date  . CESAREAN SECTION    . CESAREAN SECTION N/A 04/29/2015   Procedure: CESAREAN SECTION;  Surgeon: Noland Fordyce, MD;  Location: [facility];  Service: Obstetrics;  Laterality: N/A;  . EYE  SURGERY     Lasik    Family History  Problem Relation Age of Onset  . Diabetes Mother   . Thyroid disease Mother   . Diabetes Father   . Hypertension Father   . Hyperlipidemia Father     Social History   Socioeconomic History  . Marital status: Married    Spouse name: Not on file  . Number of children: Not on file  . Years of education: Not on file  . Highest education level: Not on file  Occupational History  . Not on file  Tobacco Use  . Smoking status: Never Smoker  . Smokeless tobacco: Never Used  Substance and Sexual Activity  . Alcohol use: No  . Drug use: No  . Sexual activity: Not on file  Other Topics Concern  . Not on file  Social History Narrative   [place]- originally- husband is a Consulting civil engineer here   Programme researcher, broadcasting/film/video (physics education)   Son born 2016   Son born 2013   Enjoys video games social media   Social Determinants of Health   Financial Resource Strain:   . Difficulty of Paying Living Expenses:   Food Insecurity:   . Worried About Programme researcher, broadcasting/film/video in the Last Year:   . Barista in  the Last Year:   Transportation Needs:   . Film/video editor (Medical):   Marland Kitchen Lack of Transportation (Non-Medical):   Physical Activity:   . Days of Exercise per Week:   . Minutes of Exercise per Session:   Stress:   . Feeling of Stress :   Social Connections:   . Frequency of Communication with Friends and Family:   . Frequency of Social Gatherings with Friends and Family:   . Attends Religious Services:   . Active Member of Clubs or Organizations:   . Attends Archivist Meetings:   Marland Kitchen Marital Status:   Intimate Partner Violence:   . Fear of Current or Ex-Partner:   . Emotionally Abused:   Marland Kitchen Physically Abused:   . Sexually Abused:     Outpatient Medications Prior to Visit  Medication Sig Dispense Refill  . cyclobenzaprine (FLEXERIL) 10 MG tablet Take 0.5-1 tablets (5-10 mg total) by mouth 2 (two) times daily as  needed for muscle spasms. 30 tablet 0  . levothyroxine (SYNTHROID) 125 MCG tablet TAKE 1 TABLET BY MOUTH EVERY DAY 90 tablet 1  . vitamin B-12 (CYANOCOBALAMIN) 500 MCG tablet Take 1 tablet (500 mcg total) by mouth daily. (Patient not taking: Reported on 11/11/2019)    . predniSONE (DELTASONE) 20 MG tablet Take 2 pills daily for 3 days, then 1 pill daily for 3 days for back pain (Patient not taking: Reported on 02/29/2020) 9 tablet 0   No facility-administered medications prior to visit.    No Known Allergies  Review of Systems  Constitutional: Negative for chills and fever.  Gastrointestinal: Negative for abdominal pain, constipation and diarrhea.  Genitourinary: Negative for difficulty urinating.  Musculoskeletal: Positive for back pain.  Skin: Negative for rash.  Neurological: Positive for weakness. Negative for numbness.      Objective:    Physical Exam  Constitutional: She is oriented to person, place, and time. She appears well-nourished. No distress.  HENT:  Head: Normocephalic.  Abdominal: Soft. Bowel sounds are normal.  Musculoskeletal:     Comments: ROM: Able to bend at waist, somewhat limited flexion.  Lumbar tenderness - paraspinous area.   Neurological: She is alert and oriented to person, place, and time. She displays abnormal reflex.  Rt patellar DTR 3+, Left patellar DTR 2+  Bilateral lower extremity strength is equal bilateral.  Positive straight leg raise elicits pain down right leg to the knee- re creating her symptom. Negative on the left.  Able to walk on heels and toes without deficit noted.   Skin: Skin is warm and dry. No rash noted.  Psychiatric: She has a normal mood and affect. Her behavior is normal.    BP 115/69 (BP Location: Left Arm, Patient Position: Sitting, Cuff Size: Large)   Pulse 72   Temp (!) 97.4 F (36.3 C) (Temporal)   Resp 18   Ht 5\' 6"  (1.676 m)   Wt 213 lb 12.8 oz (97 kg)   LMP 02/11/2020   SpO2 98%   BMI 34.51 kg/m  Wt  Readings from Last 3 Encounters:  02/29/20 213 lb 12.8 oz (97 kg)  11/16/19 232 lb (105.2 kg)  11/11/19 220 lb (99.8 kg)   CLINICAL DATA:  Low back pain for several months.  EXAM: LUMBAR SPINE - COMPLETE 4+ VIEW  COMPARISON:  None.  FINDINGS: No acute fracture or spondylolisthesis is noted. Disc spaces are well-maintained. 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 No other abnormality seen in  the lumbar spine.   Electronically Signed   By: Lupita Raider M.D.   On: 08/25/2019 15:31 Lab Results  Component Value Date   TSH 1.51 08/25/2019   Lab Results  Component Value Date   WBC 6.1 11/11/2019   HGB 13.6 11/11/2019   HCT 42.0 11/11/2019   MCV 83.8 11/11/2019   PLT 232 11/11/2019      Assessment & Plan:   Problem List Items Addressed This Visit    None    Visit Diagnoses    Lumbar radiculopathy, right    -  Primary   Relevant Orders   MR Lumbar Spine Wo Contrast   POCT urine pregnancy (Completed)   VITAMIN D 25 Hydroxy (Vit-D Deficiency, Fractures)   Chronic bilateral low back pain with right-sided sciatica       Relevant Medications   predniSONE (DELTASONE) 20 MG tablet   Other Relevant Orders   VITAMIN D 25 Hydroxy (Vit-D Deficiency, Fractures)      Meds ordered this encounter  Medications  . predniSONE (DELTASONE) 20 MG tablet    Sig: Take 2 pills daily for 3 days, then 1 pill daily for 3 days for back pain    Dispense:  9 tablet    Refill:  0    Order Specific Question:   Supervising Provider    Answer:   Dale Minneola [[id number]]    She presents with > 6 week hx of low back pain and more acute right leg pain with positive straight leg raise, perceived Rt leg weakness and abnormal DTR on the right concerning for L5 nerve compression. 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 No other abnormality seen in the lumbar spine. She would like a Vitamin D level checked.  I  have placed an order for the lumbar MRI and will repeat her prednisone taper. She may take Tylenol as needed per directions on the bottle.    Also, urine pregnancy today is negative.   Please await a call from the scheduling department.   I discussed the assessment and treatment plan with the patient/husband who was listening on the telephone  and interpreting for the patient. The patient was provided an opportunity to ask questions and he translated her questions. All of her questions were answered. The patient agreed with the plan and demonstrated an understanding of the instructions. Alarm signs to seek emergency care were reviewed: fever, bowel/bladder loss of control, increasing pain, or increasing leg weakness. Provided back pain handout.   Follow-up:  Follow up pending results of the MRI    This visit occurred during the SARS-CoV-2 public health emergency.  Safety protocols were in place, including screening questions prior to the visit, additional usage of staff PPE, and extensive cleaning of exam room while observing appropriate contact time as indicated for disinfecting solutions.   Amedeo Kinsman, NP

## 2020-03-01 LAB — VITAMIN D 25 HYDROXY (VIT D DEFICIENCY, FRACTURES): VITD: 16.33 ng/mL — ABNORMAL LOW (ref 30.00–100.00)

## 2020-03-02 ENCOUNTER — Other Ambulatory Visit: Payer: Self-pay | Admitting: Family

## 2020-03-02 MED ORDER — CHOLECALCIFEROL 1.25 MG (50000 UT) PO TABS: ORAL_TABLET | ORAL | 0 refills | Status: AC

## 2020-03-02 NOTE — Telephone Encounter (Signed)
Please see attached vitamin D order.

## 2020-03-12 ENCOUNTER — Ambulatory Visit (HOSPITAL_BASED_OUTPATIENT_CLINIC_OR_DEPARTMENT_OTHER)
Admission: RE | Admit: 2020-03-12 | Discharge: 2020-03-12 | Disposition: A | Discharge status: Home / Self Care | Payer: PPO | Source: Ambulatory Visit | Attending: Nurse Practitioner | Admitting: Nurse Practitioner

## 2020-03-12 ENCOUNTER — Telehealth: Payer: Self-pay | Admitting: Nurse Practitioner

## 2020-03-12 ENCOUNTER — Other Ambulatory Visit: Payer: Self-pay

## 2020-03-12 DIAGNOSIS — M5416 Radiculopathy, lumbar region: Secondary | ICD-10-CM | POA: Diagnosis not present

## 2020-03-12 NOTE — Telephone Encounter (Signed)
I called her home phone and could not get an answer. I LMOM to call the office on Monday for test results. I did not use names as there was no identification on the answering machine. I sent her a My Chart message.   She had a MRI of lower spine WO contrast performed on Saturday showing: IMPRESSION: Minor degenerative changes without significant stenosis.  1. If her pain is better but still present , I will consider PT.  2. If her pain is worse, I will consider a referral to [facility].

## 2020-03-14 NOTE — Telephone Encounter (Signed)
lvm for patient to call back abut MRI results

## 2020-03-18 NOTE — Telephone Encounter (Signed)
Called again today but no answer, patient has not call back. Information copied in to a letter form and mailed out to pt

## 2020-03-22 ENCOUNTER — Telehealth: Payer: Self-pay

## 2020-03-22 NOTE — Telephone Encounter (Signed)
Patient would like for NP Val EagleLendell Caprice or the nurse to follow up with her or her husband to discuss her test results. Please follow up with the patient at [phone]

## 2020-03-23 NOTE — Telephone Encounter (Signed)
Patient is calling back, letter was mailed to her with her results last week after several attempts to reach her by phone. I called her this morning and no answer. Left voice mail for her to call back.

## 2020-04-27 ENCOUNTER — Ambulatory Visit: Payer: PPO | Attending: Internal Medicine

## 2020-04-27 DIAGNOSIS — Z23 Encounter for immunization: Secondary | ICD-10-CM

## 2020-04-27 NOTE — Progress Notes (Signed)
   Covid-19 Vaccination Clinic  Name:  Melissa [name]    MRN: [id number] DOB: 10/02/1986  04/27/2020  [name] was observed post Covid-19 immunization for 15 minutes without incident. She was provided with Vaccine Information Sheet and instruction to access the V-Safe system.   [name] was instructed to call 911 with any severe reactions post vaccine: Marland Kitchen Difficulty breathing  . Swelling of face and throat  . A fast heartbeat  . A bad rash all over body  . Dizziness and weakness   Immunizations Administered    Name Date Dose VIS Date Route   Pfizer COVID-19 Vaccine 04/27/2020  9:42 AM 0.3 mL 01/27/2019 Intramuscular   Manufacturer: [employer], [employer]   Lot: [id number]   NDC: [address]-2

## 2020-05-23 ENCOUNTER — Other Ambulatory Visit: Payer: Self-pay | Admitting: Family

## 2020-05-23 ENCOUNTER — Ambulatory Visit: Payer: PPO

## 2020-05-23 DIAGNOSIS — E559 Vitamin D deficiency, unspecified: Secondary | ICD-10-CM

## 2020-05-23 NOTE — Telephone Encounter (Signed)
Please contact pt to schedule lab visit for follow up vitamin D, dx vit D deficiency.  I will refill vit D after I know what dose she needs moving forward.

## 2020-05-26 ENCOUNTER — Other Ambulatory Visit: Payer: Self-pay | Admitting: Family

## 2020-05-26 ENCOUNTER — Ambulatory Visit: Payer: PPO | Attending: Internal Medicine

## 2020-05-26 DIAGNOSIS — E039 Hypothyroidism, unspecified: Secondary | ICD-10-CM

## 2020-05-26 DIAGNOSIS — Z23 Encounter for immunization: Secondary | ICD-10-CM

## 2020-05-26 NOTE — Progress Notes (Signed)
   Covid-19 Vaccination Clinic  Name:  Di Jasmer    MRN: [id number] DOB: 04-Dec-1985  05/26/2020  Ms. Mcpheeters was observed post Covid-19 immunization for 15 minutes without incident. She was provided with Vaccine Information Sheet and instruction to access the V-Safe system.   Ms. Scallan was instructed to call 911 with any severe reactions post vaccine: Marland Kitchen Difficulty breathing  . Swelling of face and throat  . A fast heartbeat  . A bad rash all over body  . Dizziness and weakness   Immunizations Administered    Name Date Dose VIS Date Route   Pfizer COVID-19 Vaccine 05/26/2020  2:02 PM 0.3 mL 01/27/2019 Intramuscular   Manufacturer: [employer], [employer]   Lot: [id number]   NDC: [address]-8

## 2020-05-26 NOTE — Telephone Encounter (Signed)
Patient contacted, her husband stated they will go to [facility] since our lab doesn't have any openings until July 7th. Orders have been placed as well.

## 2020-06-14 ENCOUNTER — Other Ambulatory Visit (INDEPENDENT_AMBULATORY_CARE_PROVIDER_SITE_OTHER): Payer: PPO

## 2020-06-14 DIAGNOSIS — E559 Vitamin D deficiency, unspecified: Secondary | ICD-10-CM

## 2020-06-14 LAB — VITAMIN D 25 HYDROXY (VIT D DEFICIENCY, FRACTURES): VITD: 24.9 ng/mL — ABNORMAL LOW (ref 30.00–100.00)

## 2020-06-16 ENCOUNTER — Telehealth: Payer: Self-pay | Admitting: Family

## 2020-06-16 MED ORDER — VITAMIN D (ERGOCALCIFEROL) 1.25 MG (50000 UNIT) PO CAPS: 50000.0000 [IU] | ORAL_CAPSULE | ORAL | 0 refills | Status: DC

## 2020-06-16 NOTE — Telephone Encounter (Signed)
Please advise pt (can communicate with husband), that I would like her to restart vit d 50000 iu once weekly and repeat vit D level in 12 weeks.

## 2020-06-16 NOTE — Telephone Encounter (Signed)
Patient's husband advise of provider's comment. He will call back to set up 3 months follow up

## 2020-09-01 ENCOUNTER — Other Ambulatory Visit: Payer: Self-pay | Admitting: Family

## 2020-09-26 IMAGING — MR MR LUMBAR SPINE W/O CM
4 of 6 series · 27 of 48 positions shown · non-contrast
Comparison: None.

CLINICAL DATA: Low back pain, intermittent thigh pain

EXAM:
MRI LUMBAR SPINE WITHOUT CONTRAST
TECHNIQUE: Multiplanar, multisequence MR imaging of the lumbar spine was
performed. No intravenous contrast was administered.

[Series 2: T2 · sagittal · 4.0mm · 0.81mm/px · 6 of 14 slices shown (1 of 2)]
[im 1/14]
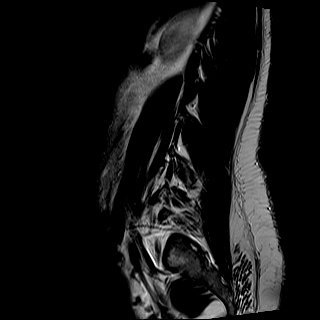
[im 3/14]
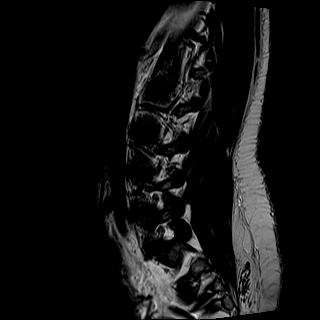
[im 6/14]
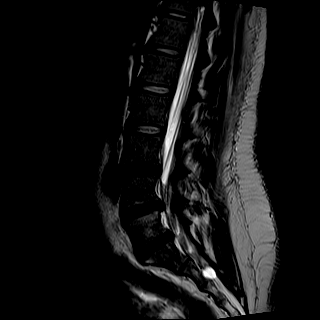
[im 8/14]
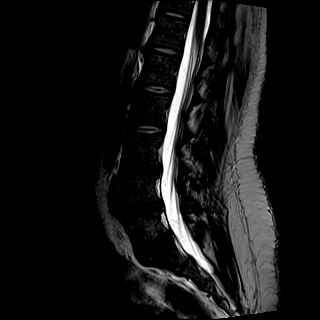
[im 11/14]
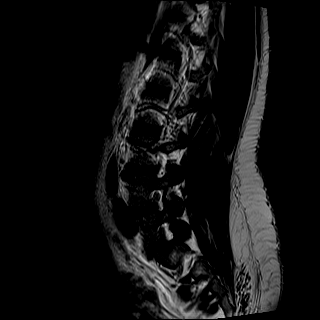
[im 14/14]
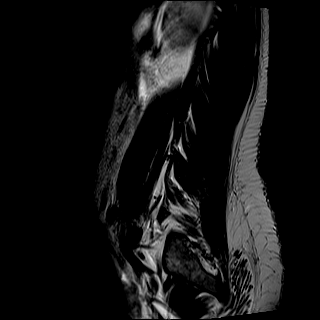

[Series 4: T1 · sagittal · 4.0mm · 0.81mm/px · 6 of 14 slices shown (1 of 2)]
[im 1/14]
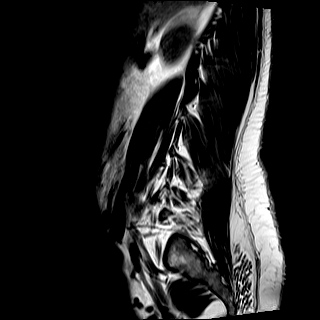
[im 3/14]
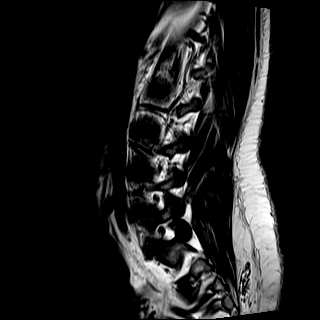
[im 6/14]
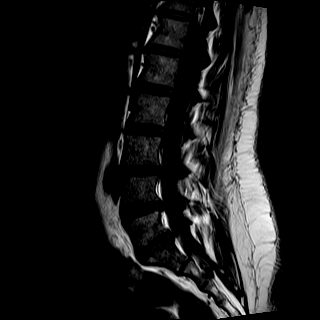
[im 8/14]
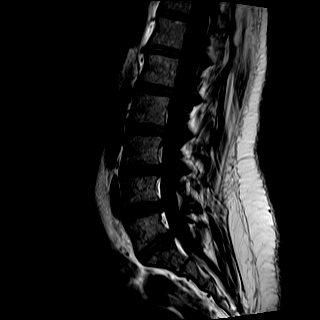
[im 11/14]
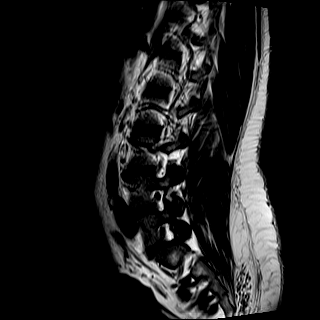
[im 14/14]
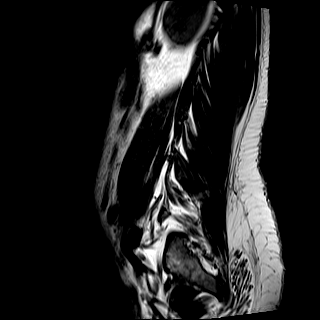

[Series 5: T2 · axial · 4.0mm · 0.39mm/px · z∈[-116,+75]mm · 9 of 30 slices shown (2 of 2)]
[im 1/30]
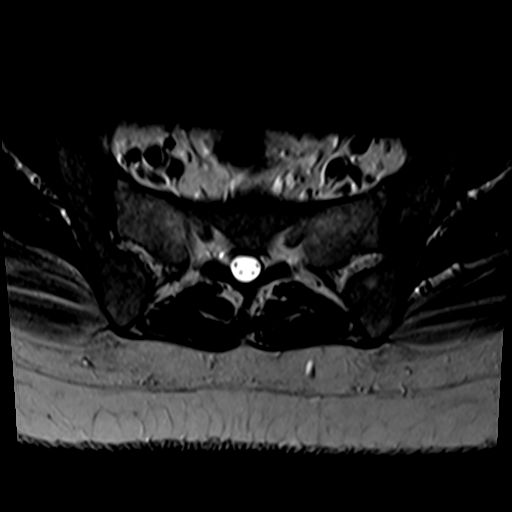
[im 6/30]
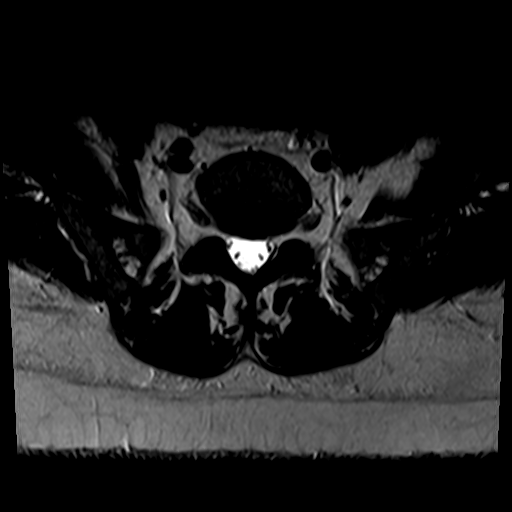
[im 8/30]
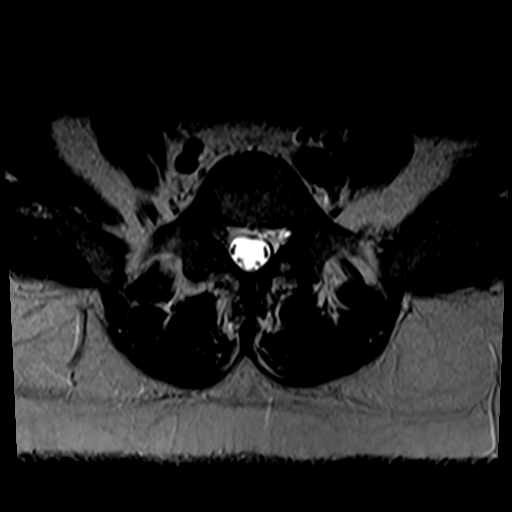
[im 14/30]
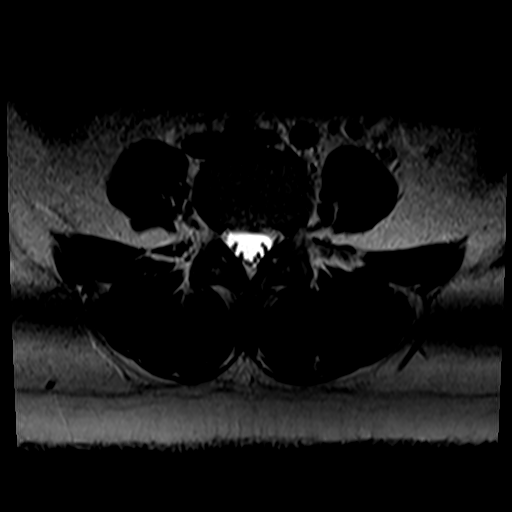
[im 16/30]
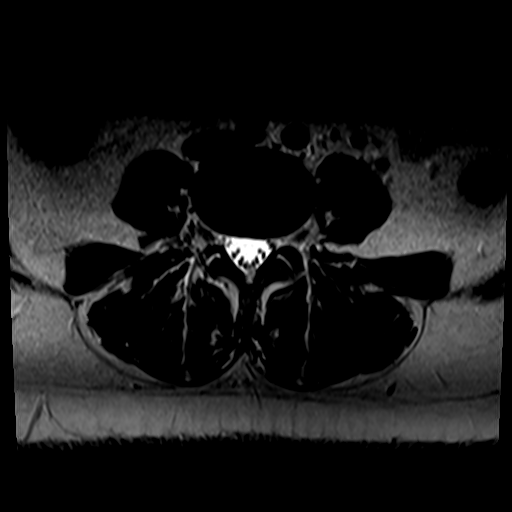
[im 22/30]
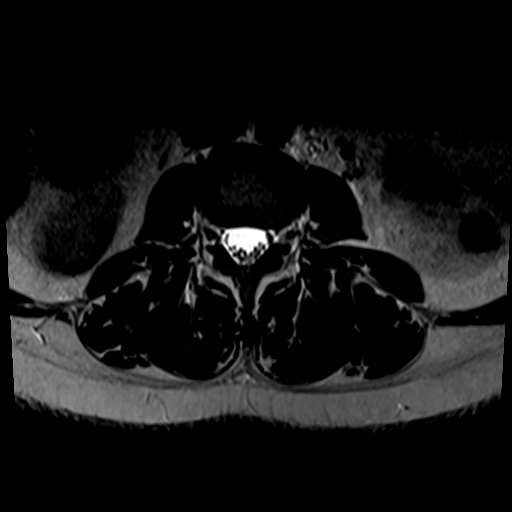
[im 24/30]
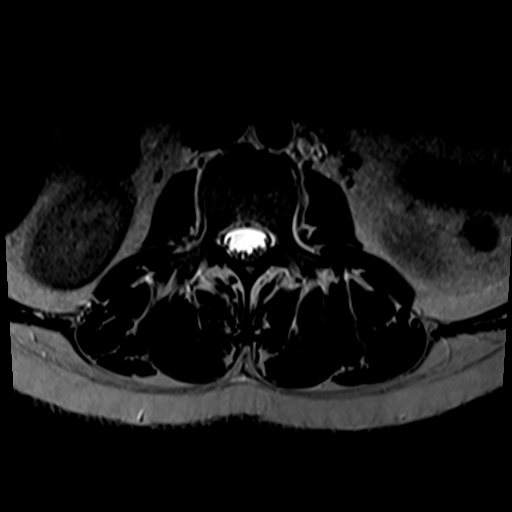
[im 27/30]
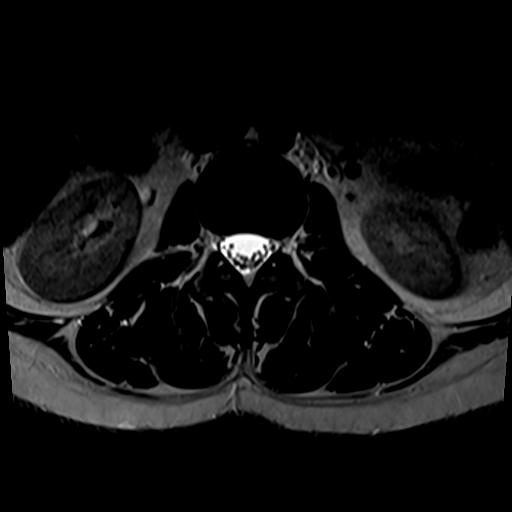
[im 30/30]
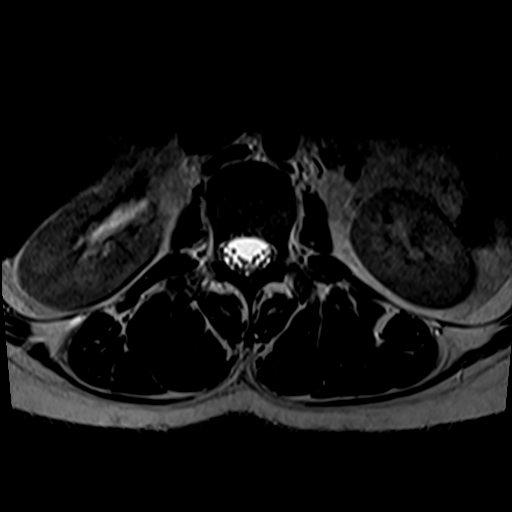

[Series 6: T1 · axial · 4.0mm · 0.39mm/px · z∈[-112,+60]mm · 6 of 30 slices shown (2 of 2)]
[im 1/30]
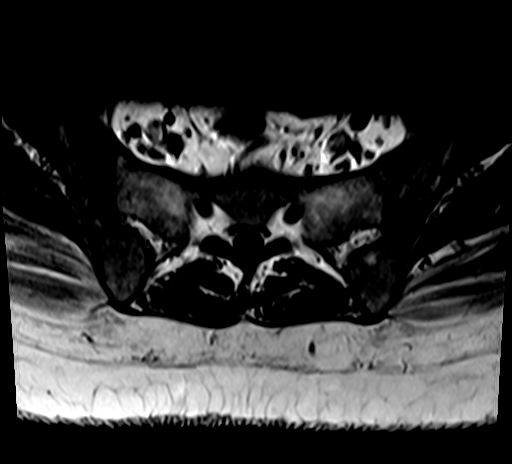
[im 6/30]
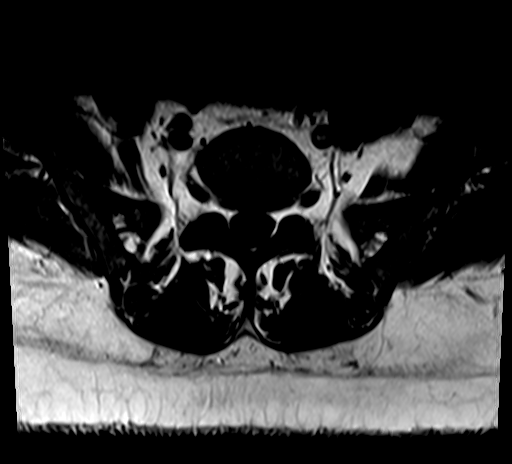
[im 8/30]
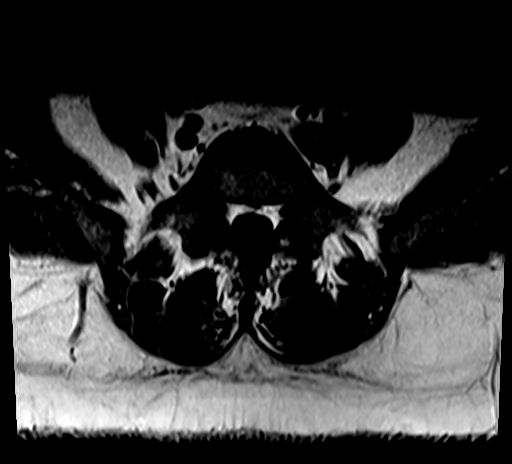
[im 14/30]
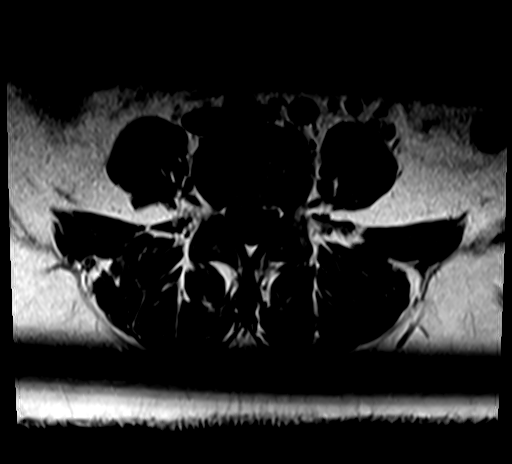
[im 16/30]
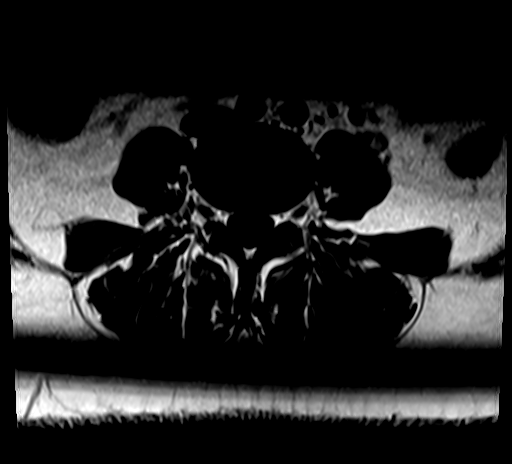
[im 27/30]
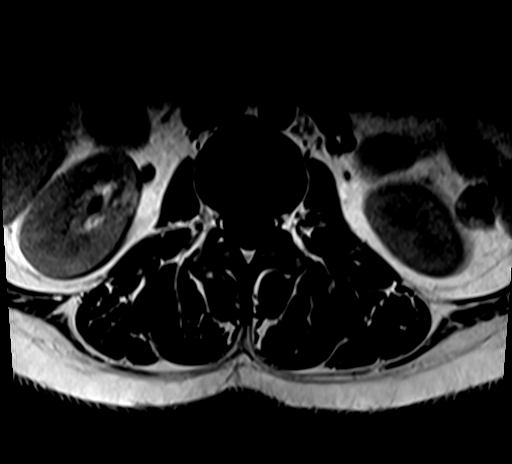

[27 of 48 positions shown; findings below may reference images not displayed]

FINDINGS: Segmentation:  Standard.

Alignment:  Anteroposterior alignment is maintained

Vertebrae: There is no acute compression deformity. Chronic
appearing degenerative endplate changes are present at L4-L5. No
suspicious osseous lesion.

Conus medullaris and cauda equina: Conus extends to the T12-L1
level. Conus and cauda equina appear normal.

Paraspinal and other soft tissues: Unremarkable

Disc levels: Disc desiccation from L3-L4 to L5-S1.

L1-L2:  No canal or foraminal stenosis.

L2-L3:  No canal or foraminal stenosis.

L3-L4: Punctate central annular fissure. No canal or foraminal
stenosis.

L4-L5: Disc bulge with punctate central annular fissure. No canal or
left foraminal stenosis. Minor right foraminal stenosis

L5-S1: Disc bulge with small central annular fissure. No canal or
foraminal stenosis.
IMPRESSION: Minor degenerative changes without significant stenosis.
